# Patient Record
Sex: Female | Born: 1980
Health system: Southern US, Community
[De-identification: ages and names within clinical notes are randomized; demographics above are authoritative.]

## PROBLEM LIST (undated history)

## (undated) DIAGNOSIS — O24419 Gestational diabetes mellitus in pregnancy, unspecified control: Secondary | ICD-10-CM

## (undated) DIAGNOSIS — O139 Gestational [pregnancy-induced] hypertension without significant proteinuria, unspecified trimester: Secondary | ICD-10-CM

## (undated) DIAGNOSIS — Z87442 Personal history of urinary calculi: Secondary | ICD-10-CM

## (undated) DIAGNOSIS — N200 Calculus of kidney: Secondary | ICD-10-CM

## (undated) DIAGNOSIS — E039 Hypothyroidism, unspecified: Secondary | ICD-10-CM

---

## 2001-08-18 DIAGNOSIS — O24419 Gestational diabetes mellitus in pregnancy, unspecified control: Secondary | ICD-10-CM

## 2001-08-18 DIAGNOSIS — O139 Gestational [pregnancy-induced] hypertension without significant proteinuria, unspecified trimester: Secondary | ICD-10-CM

## 2001-08-18 HISTORY — DX: Gestational (pregnancy-induced) hypertension without significant proteinuria, unspecified trimester: O13.9

## 2001-08-18 HISTORY — DX: Gestational diabetes mellitus in pregnancy, unspecified control: O24.419

## 2003-01-19 ENCOUNTER — Other Ambulatory Visit: Admission: RE | Admit: 2003-01-19 | Discharge: 2003-01-19 | Payer: Self-pay | Admitting: Family Medicine

## 2008-03-02 ENCOUNTER — Other Ambulatory Visit: Admission: RE | Admit: 2008-03-02 | Discharge: 2008-03-02 | Payer: Self-pay | Admitting: Obstetrics and Gynecology

## 2009-04-17 ENCOUNTER — Other Ambulatory Visit: Admission: RE | Admit: 2009-04-17 | Discharge: 2009-04-17 | Payer: Self-pay | Admitting: Obstetrics and Gynecology

## 2010-05-29 ENCOUNTER — Other Ambulatory Visit: Admission: RE | Admit: 2010-05-29 | Discharge: 2010-05-29 | Payer: Self-pay | Admitting: Obstetrics and Gynecology

## 2011-12-19 LAB — OB RESULTS CONSOLE RPR: RPR: NONREACTIVE

## 2011-12-19 LAB — OB RESULTS CONSOLE GC/CHLAMYDIA
Chlamydia: NEGATIVE
Gonorrhea: NEGATIVE

## 2012-01-08 ENCOUNTER — Other Ambulatory Visit (HOSPITAL_COMMUNITY)
Admission: RE | Admit: 2012-01-08 | Discharge: 2012-01-08 | Disposition: A | Discharge status: Home / Self Care | Payer: PRIVATE HEALTH INSURANCE | Source: Ambulatory Visit | Attending: Obstetrics and Gynecology | Admitting: Obstetrics and Gynecology

## 2012-01-08 DIAGNOSIS — Z113 Encounter for screening for infections with a predominantly sexual mode of transmission: Secondary | ICD-10-CM | POA: Insufficient documentation

## 2012-01-08 DIAGNOSIS — Z01419 Encounter for gynecological examination (general) (routine) without abnormal findings: Secondary | ICD-10-CM | POA: Insufficient documentation

## 2012-06-24 LAB — OB RESULTS CONSOLE GBS: GBS: NEGATIVE

## 2012-07-14 ENCOUNTER — Other Ambulatory Visit: Payer: Self-pay | Admitting: Obstetrics and Gynecology

## 2012-07-18 ENCOUNTER — Inpatient Hospital Stay (HOSPITAL_COMMUNITY)
Admission: RE | Admit: 2012-07-18 | Discharge: 2012-07-23 | DRG: 766 | Disposition: A | Discharge status: Home / Self Care | Payer: PRIVATE HEALTH INSURANCE | Source: Ambulatory Visit | Attending: Obstetrics and Gynecology | Admitting: Obstetrics and Gynecology

## 2012-07-18 ENCOUNTER — Encounter (HOSPITAL_COMMUNITY): Payer: Self-pay

## 2012-07-18 ENCOUNTER — Other Ambulatory Visit: Payer: Self-pay | Admitting: Obstetrics and Gynecology

## 2012-07-18 VITALS — BP 123/80 | HR 71 | Temp 97.4°F | Resp 18 | Ht 63.0 in | Wt 182.0 lb

## 2012-07-18 DIAGNOSIS — O99814 Abnormal glucose complicating childbirth: Secondary | ICD-10-CM | POA: Diagnosis present

## 2012-07-18 DIAGNOSIS — Z98891 History of uterine scar from previous surgery: Secondary | ICD-10-CM

## 2012-07-18 DIAGNOSIS — O139 Gestational [pregnancy-induced] hypertension without significant proteinuria, unspecified trimester: Principal | ICD-10-CM | POA: Diagnosis present

## 2012-07-18 DIAGNOSIS — O324XX0 Maternal care for high head at term, not applicable or unspecified: Secondary | ICD-10-CM | POA: Diagnosis present

## 2012-07-18 HISTORY — DX: Gestational (pregnancy-induced) hypertension without significant proteinuria, unspecified trimester: O13.9

## 2012-07-18 HISTORY — DX: Gestational diabetes mellitus in pregnancy, unspecified control: O24.419

## 2012-07-18 LAB — CBC
MCHC: 34.9 g/dL (ref 30.0–36.0)
Platelets: 170 10*3/uL (ref 150–400)
RDW: 13.6 % (ref 11.5–15.5)

## 2012-07-18 MED ORDER — OXYTOCIN 40 UNITS IN LACTATED RINGERS INFUSION - SIMPLE MED: 1.0000 m[IU]/min | INTRAVENOUS | Status: DC

## 2012-07-18 MED ORDER — OXYTOCIN 40 UNITS IN LACTATED RINGERS INFUSION - SIMPLE MED: 62.5000 mL/h | INTRAVENOUS | Status: DC

## 2012-07-18 MED ORDER — ACETAMINOPHEN 325 MG PO TABS: 650.0000 mg | ORAL_TABLET | ORAL | Status: DC | PRN

## 2012-07-18 MED ORDER — CITRIC ACID-SODIUM CITRATE 334-500 MG/5ML PO SOLN
30.0000 mL | ORAL | Status: DC | PRN
  Administered 2012-07-20: 30 mL via ORAL
  Filled 2012-07-18: qty 15

## 2012-07-18 MED ORDER — LACTATED RINGERS IV SOLN
INTRAVENOUS | Status: DC
  Administered 2012-07-19: 22:00:00 via INTRAVENOUS
  Administered 2012-07-19: 125 mL/h via INTRAVENOUS
  Administered 2012-07-19: 20:00:00 via INTRAVENOUS

## 2012-07-18 MED ORDER — OXYCODONE-ACETAMINOPHEN 5-325 MG PO TABS: 1.0000 | ORAL_TABLET | ORAL | Status: DC | PRN

## 2012-07-18 MED ORDER — LIDOCAINE HCL (PF) 1 % IJ SOLN
30.0000 mL | INTRAMUSCULAR | Status: DC | PRN
  Filled 2012-07-18: qty 30

## 2012-07-18 MED ORDER — LACTATED RINGERS IV SOLN
500.0000 mL | INTRAVENOUS | Status: DC | PRN
  Administered 2012-07-19: 200 mL via INTRAVENOUS
  Administered 2012-07-20 (×2): 300 mL via INTRAVENOUS

## 2012-07-18 MED ORDER — IBUPROFEN 600 MG PO TABS: 600.0000 mg | ORAL_TABLET | Freq: Four times a day (QID) | ORAL | Status: DC | PRN

## 2012-07-18 MED ORDER — MISOPROSTOL 25 MCG QUARTER TABLET
25.0000 ug | ORAL_TABLET | ORAL | Status: DC | PRN
  Administered 2012-07-18 – 2012-07-19 (×3): 25 ug via VAGINAL
  Filled 2012-07-18 (×3): qty 0.25

## 2012-07-18 MED ORDER — BUTORPHANOL TARTRATE 1 MG/ML IJ SOLN: 1.0000 mg | INTRAMUSCULAR | Status: DC | PRN

## 2012-07-18 MED ORDER — OXYTOCIN BOLUS FROM INFUSION: 500.0000 mL | INTRAVENOUS | Status: DC

## 2012-07-18 MED ORDER — TERBUTALINE SULFATE 1 MG/ML IJ SOLN: 0.2500 mg | Freq: Once | INTRAMUSCULAR | Status: AC | PRN

## 2012-07-18 MED ORDER — ONDANSETRON HCL 4 MG/2ML IJ SOLN: 4.0000 mg | Freq: Four times a day (QID) | INTRAMUSCULAR | Status: DC | PRN

## 2012-07-19 ENCOUNTER — Encounter (HOSPITAL_COMMUNITY): Payer: Self-pay | Admitting: Anesthesiology

## 2012-07-19 LAB — CBC
HCT: 36.1 % (ref 36.0–46.0)
Hemoglobin: 12.4 g/dL (ref 12.0–15.0)
MCH: 30.4 pg (ref 26.0–34.0)
MCHC: 34.3 g/dL (ref 30.0–36.0)
MCHC: 34.4 g/dL (ref 30.0–36.0)
Platelets: 162 10*3/uL (ref 150–400)
RDW: 13.6 % (ref 11.5–15.5)
RDW: 13.6 % (ref 11.5–15.5)
WBC: 14 10*3/uL — ABNORMAL HIGH (ref 4.0–10.5)

## 2012-07-19 LAB — GLUCOSE, CAPILLARY
Glucose-Capillary: 112 mg/dL — ABNORMAL HIGH (ref 70–99)
Glucose-Capillary: 83 mg/dL (ref 70–99)

## 2012-07-19 LAB — COMPREHENSIVE METABOLIC PANEL
Albumin: 3 g/dL — ABNORMAL LOW (ref 3.5–5.2)
BUN: 7 mg/dL (ref 6–23)
Calcium: 9.3 mg/dL (ref 8.4–10.5)
GFR calc Af Amer: >90 mL/min (ref >90)
Glucose, Bld: 79 mg/dL (ref 70–99)
Potassium: 4.1 mEq/L (ref 3.5–5.1)
Total Protein: 6.7 g/dL (ref 6.0–8.3)

## 2012-07-19 LAB — RPR: RPR Ser Ql: NONREACTIVE

## 2012-07-19 LAB — LACTATE DEHYDROGENASE: LDH: 190 U/L (ref 94–250)

## 2012-07-19 LAB — URIC ACID: Uric Acid, Serum: 5.3 mg/dL (ref 2.4–7.0)

## 2012-07-19 MED ORDER — EPHEDRINE 5 MG/ML INJ: 10.0000 mg | INTRAVENOUS | Status: DC | PRN

## 2012-07-19 MED ORDER — LACTATED RINGERS IV SOLN
500.0000 mL | Freq: Once | INTRAVENOUS | Status: AC
  Administered 2012-07-19: 500 mL via INTRAVENOUS
  Administered 2012-07-20 (×2): via INTRAVENOUS

## 2012-07-19 MED ORDER — EPHEDRINE 5 MG/ML INJ
10.0000 mg | INTRAVENOUS | Status: DC | PRN
  Administered 2012-07-19: 10 mg via INTRAVENOUS
  Filled 2012-07-19: qty 4

## 2012-07-19 MED ORDER — OXYTOCIN 40 UNITS IN LACTATED RINGERS INFUSION - SIMPLE MED
1.0000 m[IU]/min | INTRAVENOUS | Status: DC
  Administered 2012-07-19: 2 m[IU]/min via INTRAVENOUS
  Filled 2012-07-19: qty 1000

## 2012-07-19 MED ORDER — LIDOCAINE HCL (PF) 1 % IJ SOLN
INTRAMUSCULAR | Status: DC | PRN
  Administered 2012-07-19 (×2): 8 mL

## 2012-07-19 MED ORDER — FENTANYL 2.5 MCG/ML BUPIVACAINE 1/10 % EPIDURAL INFUSION (WH - ANES)
14.0000 mL/h | INTRAMUSCULAR | Status: DC
  Administered 2012-07-20: 14 mL/h via EPIDURAL
  Filled 2012-07-19 (×2): qty 125

## 2012-07-19 MED ORDER — PHENYLEPHRINE 40 MCG/ML (10ML) SYRINGE FOR IV PUSH (FOR BLOOD PRESSURE SUPPORT): 80.0000 ug | PREFILLED_SYRINGE | INTRAVENOUS | Status: DC | PRN

## 2012-07-19 MED ORDER — DIPHENHYDRAMINE HCL 50 MG/ML IJ SOLN: 12.5000 mg | INTRAMUSCULAR | Status: DC | PRN

## 2012-07-19 MED ORDER — PHENYLEPHRINE 40 MCG/ML (10ML) SYRINGE FOR IV PUSH (FOR BLOOD PRESSURE SUPPORT)
80.0000 ug | PREFILLED_SYRINGE | INTRAVENOUS | Status: DC | PRN
  Filled 2012-07-19: qty 5

## 2012-07-19 MED ORDER — FENTANYL 2.5 MCG/ML BUPIVACAINE 1/10 % EPIDURAL INFUSION (WH - ANES)
INTRAMUSCULAR | Status: DC | PRN
  Administered 2012-07-19: 14 mL/h via EPIDURAL

## 2012-07-19 NOTE — Progress Notes (Signed)
[name] is a 31 y.o. G1P0000 at [redacted]w[redacted]d   Subjective: Pt becoming more uncomfortable. Considering epidural. Foley bulb came out ~ 2-3 hours after it was placed.  4-5 cm per RN.  Objective: BP 108/70  Pulse 115  Temp 97.7 F (36.5 C) (Oral)  Resp 18  Ht 5\' 3"  (1.6 m)  Wt 82.555 kg (182 lb)  BMI 32.24 kg/m2      FHT:  Reactive UC:   regular, every 3 minutes SVE:   Dilation: 5 Effacement (%): 70 Station: -2 Exam by:: Dr Levora Angel AROM-Thick meconium Labs: Lab Results  Component Value Date   WBC 15.1* 07/19/2012   HGB 12.4 07/19/2012   HCT 36.1 07/19/2012   MCV 88.5 07/19/2012   PLT 170 07/19/2012    Assessment / Plan: Induction of labor due to gestational hypertension and gestational diabetes,  progressing well on pitocin  Labor: Progressing normally Preeclampsia:  no signs or symptoms of toxicity Fetal Wellbeing:  Category I Pain Control:  Epidural I/D:  n/a Anticipated MOD:  NSVD  [name] 07/19/2012, 6:58 PM

## 2012-07-19 NOTE — Progress Notes (Signed)
Pt up in bathroom 1759 to now

## 2012-07-19 NOTE — H&P (Signed)
[name] is a 31 y.o. female G1 at 72 5/7 weeks presenting for induction of labor due to Gestational HTN and Gestational diabetes.  Pt had an occasional high normal BP in the second trimester.  In the later part of the third trimester, her BP was more consistantly high normal and then became mildly elevated.  Preeclampsia was ruled out with normal labs and 24 hour protein of 49.  Her last BP in office at 39 weeks was 146/96, repeat was 121/91.  Pt also was diagnosed with GDM at 28 weeks.  S/p diabetic teaching.  Glucose monitoring was very normal.  She was decreased to twice daily CBGs.  Ultrasound done ~ 1 week ago (11/20) revealed normal EFW, 7 lbs 7 ozs. Pt and husband were counseled on risk of prolonging pregnancy in the face of the above complications.  Counseled on risk of failed induction and fetal distress with induction and were agreeable to proceed.  Maternal Medical History:  Contractions: Frequency: irregular.    Fetal activity: Perceived fetal activity is normal.    Prenatal complications: Hypertension.   Prenatal Complications - Diabetes: gestational. Diabetes is managed by diet.      OB History    Grav Para Term Preterm Abortions TAB SAB Ect Mult Living   1 0 0 0 0 0 0 0 0 0      Past Medical History  Diagnosis Date  . Pregnancy induced hypertension   . Gestational diabetes    History reviewed. No pertinent past surgical history. Family History: family history is not on file. Social History:  reports that she has never smoked. She has never used smokeless tobacco. Her alcohol and drug histories not on file.   Prenatal Transfer Tool  Maternal Diabetes: Yes:  Diabetes Type:  Diet controlled Genetic Screening: Declined Maternal Ultrasounds/Referrals: Normal Fetal Ultrasounds or other Referrals:  None Maternal Substance Abuse:  No Significant Maternal Medications:  None Significant Maternal Lab Results:  Lab values include: Group B Strep negative Other  Comments:  None  Review of Systems  Eyes: Negative for blurred vision and double vision.  Gastrointestinal: Negative for abdominal pain.  Neurological: Negative for headaches.    Dilation: Fingertip Effacement (%): Thick Station: -3 Exam by:: Madilyn Fireman Blood pressure 127/88, pulse 89, temperature 97.7 F (36.5 C), temperature source Oral, resp. rate 18, height 5\' 3"  (1.6 m), weight 82.555 kg (182 lb). Maternal Exam:  Abdomen: Estimated fetal weight is 7.5-8 lbs.   Fetal presentation: vertex  Introitus: Normal vulva. Ferning test: not done.  Nitrazine test: not done.  Pelvis: adequate for delivery.   Cervix: Cervix evaluated by digital exam.     Fetal Exam Fetal Monitor Review: Variability: moderate (6-25 bpm).   Pattern: no decelerations.    Fetal State Assessment: Category I - tracings are normal.     Physical Exam  Constitutional: She is oriented to person, place, and time. She appears well-developed and well-nourished. No distress.  HENT:  Head: Normocephalic and atraumatic.  Eyes: EOM are normal.  Neck: Normal range of motion.  Cardiovascular: Normal rate and regular rhythm.   Respiratory: Effort normal and breath sounds normal. No respiratory distress. She has no wheezes. She has no rales.  GI: Soft. There is no tenderness.  Genitourinary: Uterus normal.  Musculoskeletal: Normal range of motion. She exhibits edema. She exhibits no tenderness.  Neurological: She is alert and oriented to person, place, and time.  Skin: Skin is warm and dry. She is not diaphoretic.  Psychiatric: She has a  normal mood and affect.    Prenatal labs: ABO, Rh: O/Positive/-- (05/03 0000) Antibody: Negative (05/03 0000) Rubella: Immune (05/03 0000) RPR: NON REACTIVE (12/01 2036)  HBsAg: Negative (05/03 0000)  HIV: Non-reactive (05/03 0000)  GBS:   Negative  CBC normal Hg, plts normal Preeclampsia labs not done.  Assessment/Plan: IUP at 39 5/7 weeks. IOL due to [facility] and  GDM. Cervix soft but not dilated.  Due to contractions, will proceed with Pitocin.  Unable to place a foley bulb.  Will attempt to place if still unfavorable at next check. Check preeclampsia labs. CBG q 4 hour.   Geryl Rankins 07/19/2012, 8:32 AM

## 2012-07-19 NOTE — Progress Notes (Signed)
[name] is a 31 y.o. G1P0000 at [redacted]w[redacted]d   Subjective: Pt comfortable s/p epidural.  Some increased pelvic pressure.  Cervix unchanged with last check at 9 pm per RN.  Objective: BP 121/69  Pulse 91  Temp 98.2 F (36.8 C) (Oral)  Resp 20  Ht 5\' 3"  (1.6 m)  Wt 82.555 kg (182 lb)  BMI 32.24 kg/m2  SpO2 100%      FHT:  Accels, early and variable decels..  Prolonged decel after IUPC placed and pt turned to left side.  Resolved with change of position to right side. UC:   regular, every 4-5 minutes SVE:   8/90/0, cervix very stretchy IUPC placed posteriorly, left without difficulty Labs: Lab Results  Component Value Date   WBC 14.0* 07/19/2012   HGB 12.3 07/19/2012   HCT 35.8* 07/19/2012   MCV 88.6 07/19/2012   PLT 162 07/19/2012    Assessment / Plan: Induction of labor due to gestational hypertension and gestational diabetes,  progressing well on pitocin  Labor: Progressing normally Preeclampsia:  no signs or symptoms of toxicity Fetal Wellbeing:  Category I Pain Control:  Epidural I/D:  n/a Anticipated MOD:  NSVD  Dreyah Montrose 07/19/2012, 10:56 PM

## 2012-07-19 NOTE — Anesthesia Preprocedure Evaluation (Signed)
Anesthesia Evaluation  Patient identified by MRN, date of birth, ID band Patient awake    Reviewed: Allergy & Precautions, H&P , NPO status , Patient's Chart, lab work & pertinent test results  Airway Mallampati: II TM Distance: >3 FB Neck ROM: full    Dental No notable dental hx.    Pulmonary neg pulmonary ROS,    Pulmonary exam normal       Cardiovascular     Neuro/Psych negative neurological ROS  negative psych ROS   GI/Hepatic negative GI ROS, Neg liver ROS,   Endo/Other  diabetes, Gestational  Renal/GU negative Renal ROS  negative genitourinary   Musculoskeletal negative musculoskeletal ROS (+)   Abdominal Normal abdominal exam  (+)   Peds negative pediatric ROS (+)  Hematology negative hematology ROS (+)   Anesthesia Other Findings   Reproductive/Obstetrics (+) Pregnancy                           Anesthesia Physical Anesthesia Plan  ASA: III  Anesthesia Plan: Epidural   Post-op Pain Management:    Induction:   Airway Management Planned:   Additional Equipment:   Intra-op Plan:   Post-operative Plan:   Informed Consent: I have reviewed the patients History and Physical, chart, labs and discussed the procedure including the risks, benefits and alternatives for the proposed anesthesia with the patient or authorized representative who has indicated his/her understanding and acceptance.     Plan Discussed with:   Anesthesia Plan Comments:         Anesthesia Quick Evaluation

## 2012-07-19 NOTE — Progress Notes (Signed)
Patient ID: [name], female   DOB: Sep 20, 1980, 31 y.o.   MRN: [id number]  Pt feeling  Contractions with Pitocin.  No headaches or visual changes.  Pt agreeable to Foley bulb placement after counseling on R/B/A.  BP 131/92 Gen: NAD Cvx 1/70/-3, less posterior EM:  Reassuring, Reactive.  Contractions q 2 min Pitocin 8 milliunits  Foley bulb placed with sterile technique, 60 cc.  No bleeding.  Pt tolerated well.  CBG 115, 117, 71  A/P:  IOL at 39 5/7 weeks Responding well to Pitocin.  S/p Foley bulb for mechanical dilation BPs normal.  Preeclampsia labs pending. CBGs normal. Continue current management.

## 2012-07-19 NOTE — Anesthesia Procedure Notes (Signed)
Epidural Patient location during procedure: OB Start time: 07/19/2012 7:38 PM End time: 07/19/2012 7:42 PM  Staffing Anesthesiologist: Sandrea Hughs Performed by: anesthesiologist   Preanesthetic Checklist Completed: patient identified, site marked, surgical consent, pre-op evaluation, timeout performed, IV checked, risks and benefits discussed and monitors and equipment checked  Epidural Patient position: sitting Prep: site prepped and draped and DuraPrep Patient monitoring: continuous pulse ox and blood pressure Approach: midline Injection technique: LOR air  Needle:  Needle type: Tuohy  Needle gauge: 17 G Needle length: 9 cm and 9 Needle insertion depth: 5 cm cm Catheter type: closed end flexible Catheter size: 19 Gauge Catheter at skin depth: 10 cm Test dose: negative and Other  Assessment Sensory level: T8 Events: blood not aspirated, injection not painful, no injection resistance, negative IV test and no paresthesia  Additional Notes Reason for block:procedure for pain

## 2012-07-20 ENCOUNTER — Encounter (HOSPITAL_COMMUNITY): Payer: Self-pay | Admitting: Anesthesiology

## 2012-07-20 ENCOUNTER — Inpatient Hospital Stay (HOSPITAL_COMMUNITY): Payer: PRIVATE HEALTH INSURANCE | Admitting: Anesthesiology

## 2012-07-20 ENCOUNTER — Encounter (HOSPITAL_COMMUNITY)
Admission: RE | Disposition: A | Discharge status: Home / Self Care | Payer: Self-pay | Source: Ambulatory Visit | Attending: Obstetrics and Gynecology

## 2012-07-20 ENCOUNTER — Encounter (HOSPITAL_COMMUNITY): Payer: Self-pay

## 2012-07-20 LAB — GLUCOSE, CAPILLARY: Glucose-Capillary: 80 mg/dL (ref 70–99)

## 2012-07-20 SURGERY — Surgical Case
Anesthesia: Epidural | Site: Abdomen | Wound class: Clean Contaminated

## 2012-07-20 MED ORDER — ONDANSETRON HCL 4 MG/2ML IJ SOLN: 4.0000 mg | Freq: Three times a day (TID) | INTRAMUSCULAR | Status: DC | PRN

## 2012-07-20 MED ORDER — DIPHENHYDRAMINE HCL 50 MG/ML IJ SOLN: 12.5000 mg | INTRAMUSCULAR | Status: DC | PRN

## 2012-07-20 MED ORDER — DIPHENHYDRAMINE HCL 25 MG PO CAPS: 25.0000 mg | ORAL_CAPSULE | Freq: Four times a day (QID) | ORAL | Status: DC | PRN

## 2012-07-20 MED ORDER — DIBUCAINE 1 % RE OINT: 1.0000 "application " | TOPICAL_OINTMENT | RECTAL | Status: DC | PRN

## 2012-07-20 MED ORDER — FERROUS SULFATE 325 (65 FE) MG PO TABS
325.0000 mg | ORAL_TABLET | Freq: Two times a day (BID) | ORAL | Status: DC
  Administered 2012-07-20 – 2012-07-23 (×6): 325 mg via ORAL
  Filled 2012-07-20 (×6): qty 1

## 2012-07-20 MED ORDER — CEFAZOLIN SODIUM-DEXTROSE 2-3 GM-% IV SOLR
INTRAVENOUS | Status: DC | PRN
  Administered 2012-07-20: 2 g via INTRAVENOUS

## 2012-07-20 MED ORDER — ONDANSETRON HCL 4 MG/2ML IJ SOLN
INTRAMUSCULAR | Status: AC
  Filled 2012-07-20: qty 2

## 2012-07-20 MED ORDER — WITCH HAZEL-GLYCERIN EX PADS: 1.0000 "application " | MEDICATED_PAD | CUTANEOUS | Status: DC | PRN

## 2012-07-20 MED ORDER — TETANUS-DIPHTH-ACELL PERTUSSIS 5-2.5-18.5 LF-MCG/0.5 IM SUSP: 0.5000 mL | Freq: Once | INTRAMUSCULAR | Status: DC

## 2012-07-20 MED ORDER — ZOLPIDEM TARTRATE 5 MG PO TABS: 5.0000 mg | ORAL_TABLET | Freq: Every evening | ORAL | Status: DC | PRN

## 2012-07-20 MED ORDER — DIPHENHYDRAMINE HCL 50 MG/ML IJ SOLN: 25.0000 mg | INTRAMUSCULAR | Status: DC | PRN

## 2012-07-20 MED ORDER — EPHEDRINE 5 MG/ML INJ
INTRAVENOUS | Status: AC
  Filled 2012-07-20: qty 10

## 2012-07-20 MED ORDER — METOCLOPRAMIDE HCL 5 MG/ML IJ SOLN: 10.0000 mg | Freq: Three times a day (TID) | INTRAMUSCULAR | Status: DC | PRN

## 2012-07-20 MED ORDER — MENTHOL 3 MG MT LOZG: 1.0000 | LOZENGE | OROMUCOSAL | Status: DC | PRN

## 2012-07-20 MED ORDER — SODIUM BICARBONATE 8.4 % IV SOLN
INTRAVENOUS | Status: DC | PRN
  Administered 2012-07-20 (×2): 5 mL via EPIDURAL

## 2012-07-20 MED ORDER — LACTATED RINGERS IV SOLN: INTRAVENOUS | Status: DC | PRN

## 2012-07-20 MED ORDER — LANOLIN HYDROUS EX OINT: 1.0000 "application " | TOPICAL_OINTMENT | CUTANEOUS | Status: DC | PRN

## 2012-07-20 MED ORDER — MISOPROSTOL 200 MCG PO TABS: 800.0000 ug | ORAL_TABLET | Freq: Once | ORAL | Status: AC | PRN

## 2012-07-20 MED ORDER — MORPHINE SULFATE (PF) 0.5 MG/ML IJ SOLN
INTRAMUSCULAR | Status: DC | PRN
  Administered 2012-07-20: 2000 ug via INTRAVENOUS
  Administered 2012-07-20: 3000 ug via EPIDURAL

## 2012-07-20 MED ORDER — LIDOCAINE HCL (CARDIAC) 20 MG/ML IV SOLN
INTRAVENOUS | Status: AC
  Filled 2012-07-20: qty 5

## 2012-07-20 MED ORDER — SODIUM CHLORIDE 0.9 % IJ SOLN: 3.0000 mL | INTRAMUSCULAR | Status: DC | PRN

## 2012-07-20 MED ORDER — HYDROMORPHONE HCL PF 1 MG/ML IJ SOLN: 0.2500 mg | INTRAMUSCULAR | Status: DC | PRN

## 2012-07-20 MED ORDER — FENTANYL CITRATE 0.05 MG/ML IJ SOLN
INTRAMUSCULAR | Status: AC
  Filled 2012-07-20: qty 2

## 2012-07-20 MED ORDER — KETOROLAC TROMETHAMINE 30 MG/ML IJ SOLN: 30.0000 mg | Freq: Four times a day (QID) | INTRAMUSCULAR | Status: AC | PRN

## 2012-07-20 MED ORDER — OXYTOCIN 40 UNITS IN LACTATED RINGERS INFUSION - SIMPLE MED: 62.5000 mL/h | INTRAVENOUS | Status: AC

## 2012-07-20 MED ORDER — SCOPOLAMINE 1 MG/3DAYS TD PT72
MEDICATED_PATCH | TRANSDERMAL | Status: AC
  Administered 2012-07-20: 1.5 mg via TRANSDERMAL
  Filled 2012-07-20: qty 1

## 2012-07-20 MED ORDER — FAMOTIDINE 20 MG PO TABS
20.0000 mg | ORAL_TABLET | Freq: Every day | ORAL | Status: DC
  Administered 2012-07-23: 20 mg via ORAL
  Filled 2012-07-20 (×2): qty 1

## 2012-07-20 MED ORDER — ONDANSETRON HCL 4 MG/2ML IJ SOLN: 4.0000 mg | INTRAMUSCULAR | Status: DC | PRN

## 2012-07-20 MED ORDER — KETOROLAC TROMETHAMINE 60 MG/2ML IM SOLN
60.0000 mg | Freq: Once | INTRAMUSCULAR | Status: AC | PRN
  Administered 2012-07-20: 60 mg via INTRAMUSCULAR

## 2012-07-20 MED ORDER — SIMETHICONE 80 MG PO CHEW
80.0000 mg | CHEWABLE_TABLET | Freq: Three times a day (TID) | ORAL | Status: DC
  Administered 2012-07-20 – 2012-07-23 (×11): 80 mg via ORAL

## 2012-07-20 MED ORDER — SENNOSIDES-DOCUSATE SODIUM 8.6-50 MG PO TABS
2.0000 | ORAL_TABLET | Freq: Every day | ORAL | Status: DC
  Administered 2012-07-20 – 2012-07-22 (×3): 2 via ORAL

## 2012-07-20 MED ORDER — MEPERIDINE HCL 25 MG/ML IJ SOLN
INTRAMUSCULAR | Status: DC | PRN
  Administered 2012-07-20 (×2): 12.5 mg via INTRAVENOUS

## 2012-07-20 MED ORDER — LACTATED RINGERS IV SOLN
INTRAVENOUS | Status: DC
  Administered 2012-07-20: 11:00:00 via INTRAVENOUS

## 2012-07-20 MED ORDER — NALBUPHINE HCL 10 MG/ML IJ SOLN
5.0000 mg | INTRAMUSCULAR | Status: DC | PRN
  Filled 2012-07-20: qty 1

## 2012-07-20 MED ORDER — KETOROLAC TROMETHAMINE 60 MG/2ML IM SOLN
INTRAMUSCULAR | Status: AC
  Administered 2012-07-20: 60 mg via INTRAMUSCULAR
  Filled 2012-07-20: qty 2

## 2012-07-20 MED ORDER — KETOROLAC TROMETHAMINE 30 MG/ML IJ SOLN: 15.0000 mg | Freq: Once | INTRAMUSCULAR | Status: DC | PRN

## 2012-07-20 MED ORDER — ONDANSETRON HCL 4 MG PO TABS: 4.0000 mg | ORAL_TABLET | ORAL | Status: DC | PRN

## 2012-07-20 MED ORDER — OXYTOCIN 10 UNIT/ML IJ SOLN
INTRAMUSCULAR | Status: AC
  Filled 2012-07-20: qty 4

## 2012-07-20 MED ORDER — SIMETHICONE 80 MG PO CHEW: 80.0000 mg | CHEWABLE_TABLET | ORAL | Status: DC | PRN

## 2012-07-20 MED ORDER — NALOXONE HCL 0.4 MG/ML IJ SOLN: 0.4000 mg | INTRAMUSCULAR | Status: DC | PRN

## 2012-07-20 MED ORDER — MEPERIDINE HCL 25 MG/ML IJ SOLN
INTRAMUSCULAR | Status: AC
  Filled 2012-07-20: qty 1

## 2012-07-20 MED ORDER — OXYTOCIN 40 UNITS IN LACTATED RINGERS INFUSION - SIMPLE MED: 1.0000 m[IU]/min | INTRAVENOUS | Status: DC

## 2012-07-20 MED ORDER — ONDANSETRON HCL 4 MG/2ML IJ SOLN: 4.0000 mg | Freq: Once | INTRAMUSCULAR | Status: DC | PRN

## 2012-07-20 MED ORDER — OXYTOCIN 10 UNIT/ML IJ SOLN
40.0000 [IU] | INTRAVENOUS | Status: DC | PRN
  Administered 2012-07-20: 40 [IU] via INTRAVENOUS

## 2012-07-20 MED ORDER — SODIUM BICARBONATE 8.4 % IV SOLN
INTRAVENOUS | Status: AC
  Filled 2012-07-20: qty 50

## 2012-07-20 MED ORDER — 0.9 % SODIUM CHLORIDE (POUR BTL) OPTIME
TOPICAL | Status: DC | PRN
  Administered 2012-07-20: 1000 mL

## 2012-07-20 MED ORDER — IBUPROFEN 600 MG PO TABS
600.0000 mg | ORAL_TABLET | Freq: Four times a day (QID) | ORAL | Status: DC
  Administered 2012-07-21 – 2012-07-23 (×10): 600 mg via ORAL
  Filled 2012-07-20 (×9): qty 1

## 2012-07-20 MED ORDER — CLINDAMYCIN PHOSPHATE 900 MG/50ML IV SOLN
900.0000 mg | Freq: Once | INTRAVENOUS | Status: AC
  Administered 2012-07-20: 900 mg via INTRAVENOUS
  Filled 2012-07-20: qty 50

## 2012-07-20 MED ORDER — OXYCODONE-ACETAMINOPHEN 5-325 MG PO TABS
1.0000 | ORAL_TABLET | ORAL | Status: DC | PRN
  Administered 2012-07-22: 1 via ORAL
  Filled 2012-07-20: qty 1

## 2012-07-20 MED ORDER — KETOROLAC TROMETHAMINE 30 MG/ML IJ SOLN
30.0000 mg | Freq: Four times a day (QID) | INTRAMUSCULAR | Status: AC | PRN
  Administered 2012-07-20: 30 mg via INTRAVENOUS
  Filled 2012-07-20: qty 1

## 2012-07-20 MED ORDER — PRENATAL MULTIVITAMIN CH
1.0000 | ORAL_TABLET | Freq: Every day | ORAL | Status: DC
  Administered 2012-07-21 – 2012-07-23 (×3): 1 via ORAL
  Filled 2012-07-20 (×3): qty 1

## 2012-07-20 MED ORDER — MORPHINE SULFATE 0.5 MG/ML IJ SOLN
INTRAMUSCULAR | Status: AC
  Filled 2012-07-20: qty 10

## 2012-07-20 MED ORDER — SCOPOLAMINE 1 MG/3DAYS TD PT72
1.0000 | MEDICATED_PATCH | Freq: Once | TRANSDERMAL | Status: AC
  Administered 2012-07-20: 1.5 mg via TRANSDERMAL

## 2012-07-20 MED ORDER — MEPERIDINE HCL 25 MG/ML IJ SOLN: 6.2500 mg | INTRAMUSCULAR | Status: DC | PRN

## 2012-07-20 MED ORDER — ONDANSETRON HCL 4 MG/2ML IJ SOLN
INTRAMUSCULAR | Status: DC | PRN
  Administered 2012-07-20: 4 mg via INTRAVENOUS

## 2012-07-20 MED ORDER — FENTANYL CITRATE 0.05 MG/ML IJ SOLN
INTRAMUSCULAR | Status: DC | PRN
  Administered 2012-07-20 (×2): 50 ug via INTRAVENOUS

## 2012-07-20 MED ORDER — DIPHENHYDRAMINE HCL 25 MG PO CAPS
25.0000 mg | ORAL_CAPSULE | ORAL | Status: DC | PRN
  Filled 2012-07-20: qty 1

## 2012-07-20 MED ORDER — PHENYLEPHRINE 40 MCG/ML (10ML) SYRINGE FOR IV PUSH (FOR BLOOD PRESSURE SUPPORT)
PREFILLED_SYRINGE | INTRAVENOUS | Status: AC
  Filled 2012-07-20: qty 5

## 2012-07-20 MED ORDER — NALOXONE HCL 1 MG/ML IJ SOLN: 1.0000 ug/kg/h | INTRAVENOUS | Status: DC | PRN

## 2012-07-20 SURGICAL SUPPLY — 44 items
BARRIER ADHS 3X4 INTERCEED (GAUZE/BANDAGES/DRESSINGS) ×2 IMPLANT
BENZOIN TINCTURE PRP APPL 2/3 (GAUZE/BANDAGES/DRESSINGS) ×2 IMPLANT
CLOTH BEACON ORANGE TIMEOUT ST (SAFETY) ×2 IMPLANT
DERMABOND ADVANCED (GAUZE/BANDAGES/DRESSINGS)
DERMABOND ADVANCED .7 DNX12 (GAUZE/BANDAGES/DRESSINGS) IMPLANT
DRESSING TELFA 8X3 (GAUZE/BANDAGES/DRESSINGS) ×2 IMPLANT
DRSG COVADERM 4X10 (GAUZE/BANDAGES/DRESSINGS) ×2 IMPLANT
DURAPREP 26ML APPLICATOR (WOUND CARE) ×2 IMPLANT
ELECT REM PT RETURN 9FT ADLT (ELECTROSURGICAL) ×2
ELECTRODE REM PT RTRN 9FT ADLT (ELECTROSURGICAL) ×1 IMPLANT
EXTRACTOR VACUUM BELL STYLE (SUCTIONS) ×2 IMPLANT
GAUZE SPONGE 4X4 12PLY STRL LF (GAUZE/BANDAGES/DRESSINGS) ×4 IMPLANT
GLOVE BIO SURGEON STRL SZ7 (GLOVE) ×2 IMPLANT
GLOVE BIOGEL PI IND STRL 6.5 (GLOVE) ×2 IMPLANT
GLOVE BIOGEL PI IND STRL 7.0 (GLOVE) ×2 IMPLANT
GLOVE BIOGEL PI INDICATOR 6.5 (GLOVE) ×2
GLOVE BIOGEL PI INDICATOR 7.0 (GLOVE) ×2
GLOVE ECLIPSE 6.0 STRL STRAW (GLOVE) ×4 IMPLANT
GOWN PREVENTION PLUS LG XLONG (DISPOSABLE) ×4 IMPLANT
GOWN PREVENTION PLUS XLARGE (GOWN DISPOSABLE) IMPLANT
KIT ABG SYR 3ML LUER SLIP (SYRINGE) IMPLANT
NEEDLE HYPO 25X5/8 SAFETYGLIDE (NEEDLE) IMPLANT
NS IRRIG 1000ML POUR BTL (IV SOLUTION) ×2 IMPLANT
PACK C SECTION WH (CUSTOM PROCEDURE TRAY) ×2 IMPLANT
PAD ABD 7.5X8 STRL (GAUZE/BANDAGES/DRESSINGS) ×2 IMPLANT
PAD OB MATERNITY 4.3X12.25 (PERSONAL CARE ITEMS) ×2 IMPLANT
RTRCTR C-SECT PINK 25CM LRG (MISCELLANEOUS) ×2 IMPLANT
SLEEVE SCD COMPRESS KNEE MED (MISCELLANEOUS) ×2 IMPLANT
SPONGE GAUZE 4X4 12PLY (GAUZE/BANDAGES/DRESSINGS) ×2 IMPLANT
STRIP CLOSURE SKIN 1/2X4 (GAUZE/BANDAGES/DRESSINGS) ×2 IMPLANT
SUT CHROMIC 0 CTX 36 (SUTURE) ×6 IMPLANT
SUT MNCRL AB 4-0 PS2 18 (SUTURE) ×2 IMPLANT
SUT PLAIN 2 0 (SUTURE)
SUT PLAIN 2 0 XLH (SUTURE) ×2 IMPLANT
SUT PLAIN ABS 2-0 54XMFL TIE (SUTURE) IMPLANT
SUT VIC AB 0 CT1 27 (SUTURE) ×1
SUT VIC AB 0 CT1 27XBRD ANBCTR (SUTURE) ×1 IMPLANT
SUT VIC AB 2-0 CT1 27 (SUTURE) ×1
SUT VIC AB 2-0 CT1 TAPERPNT 27 (SUTURE) ×1 IMPLANT
SUT VIC AB 4-0 KS 27 (SUTURE) IMPLANT
TAPE CLOTH SURG 4X10 WHT LF (GAUZE/BANDAGES/DRESSINGS) ×2 IMPLANT
TOWEL OR 17X24 6PK STRL BLUE (TOWEL DISPOSABLE) ×4 IMPLANT
TRAY FOLEY CATH 14FR (SET/KITS/TRAYS/PACK) IMPLANT
WATER STERILE IRR 1000ML POUR (IV SOLUTION) IMPLANT

## 2012-07-20 NOTE — Anesthesia Postprocedure Evaluation (Deleted)
Anesthesia Post Note  Patient: [name]  Procedure(s) Performed: Procedure(s) (LRB): CESAREAN SECTION (N/A)  Anesthesia type: epidural  Patient location: PACU  Post pain: Pain level controlled  Post assessment: Post-op Vital signs reviewed  Last Vitals:  Filed Vitals:   07/20/12 0605  BP: 140/77  Pulse: 81  Temp:   Resp:     Post vital signs: Reviewed  Level of consciousness: awake  Complications: No apparent anesthesia complications

## 2012-07-20 NOTE — Progress Notes (Signed)
Patient ID: [name], female   DOB: September 23, 1980, 31 y.o.   MRN: [id number]  In to assess pt.   Pt comfortable but has hot spot on right lower abdomen.   121/77 Gen:  NAD EM:  Good variability, variable decels with a late component in left lateral decubitus.  Resolved repositioning. Cervix:  Anterior lip reducible, +1 station. Trial of pushing with prolonged fetal decels.  Maternal effort with pushing was minimally to moderately effective. Stopped pushing to allow baby to labor down to +2 station and resuscitation with IVF bolus. Monitor fetal heart rate closely.  If non-reassuring despite efforts proceed with c-section. Discussed with pt and husband.

## 2012-07-20 NOTE — Op Note (Signed)
[name], [name]            ACCOUNT NO.:  [id number]  MEDICAL RECORD NO.:  [id number]  LOCATION:  [facility]                          FACILITY:  [facility]  PHYSICIAN:  Pieter Partridge, MD   DATE OF BIRTH:  10/31/1980  DATE OF PROCEDURE:  07/20/2012 DATE OF DISCHARGE:                              OPERATIVE REPORT   PREOPERATIVE DIAGNOSES:  Pregnancy at 39-6/7th weeks, failure to descend, gestational hypertension, gestational diabetes.  POSTOPERATIVE DIAGNOSES:  Pregnancy at 39-6/7th weeks, failure to descend, gestational hypertension, gestational diabetes.  SURGEON:  Pieter Partridge, MD  ASSISTANT:  Technician.  ANESTHESIA:  Epidural.  IV FLUIDS:  In 1000, urine 250 out, blood tinged to clear.  ESTIMATED BLOOD LOSS:  700.  DRAINS:  Foley catheter.  No local use.  SPECIMENS:  Placenta to pathology.  DISPOSITION:  To PACU hemodynamically stable.  No complications.  FINDINGS:  Vertex female, suspect occiput posterior position.  Apgars 9 and 9.  Velamentous insertion of umbilical cord noted.  Terminal meconium also.  Normal uterus, tubes.  INDICATION FOR PROCEDURE:  [name] is a 31 year old gravida 1 at 86 and 6/7th weeks who was admitted for induction of labor due to gestational hypertension and gestational diabetes.  She underwent cervical ripening of approximately 3 doses.  She then had Pitocin induction with Foley bulb and progressed to 9 cm and probably became complete after a few hours.  She pushed for approximately 2 hours with no descent and caput was increasing.  Due to protracted labor course previously and increasing caput, the patient was counseled on primary low transverse cesarean section.  PROCEDURE IN DETAIL:  The patient was taken to the operating room and placed in the dorsal supine position.  Epidural anesthesia was optimized.  She was then prepped and draped in normal sterile fashion. Time-out was taken prior to draping.  After adequate anesthesia  confirmed, a Pfannenstiel skin incision was made 2 cm above the symphysis pubis after the abdomen was marked. Incision was made with a scalpel and carried down to the underlying layer of fascia with a Bovie.  The subcutaneous base was cauterized as needed with Bovie.  Fascia was incised and the incision extended laterally.  The rectus muscles were then dissected off the muscles and entered the fascia sharply with Kocher clamps.  Same was done on the lower leaf.  The muscles were cut at the midline to open and reveal the peritoneum.  Peritoneum was identified and entered sharply with the Metzenbaum scissors.  The peritoneum was stretched and cut with Metzenbaum scissors.  Adequate hemostasis was confirmed.  The Alexis retractor was then placed.  The serosa was then tented up with Russians and entered sharply with the Metzenbaum scissors and a bladder flap was developed.  A transverse incision on the lower uterine segment was made with a scalpel.  Meconium noted and the amnion was incised.  Incision was extended laterally with the bandage scissors.  The hands were noted at the incision.  The occiput was delivered easily through the incision.  Just by palpation, I felt that the baby was OP.  The baby was not wedged into the pelvis, and again was easily delivered through the incision.  Adequate fundal pressure was applied.  However, the occiput was not delivered.  Vacuum was applied to the occiput atraumatically and the baby was then delivered.  No nuchal cord noted.  Nose and mouth were suctioned. Shoulders delivered easily.  The baby continued to be suctioned.  Cord clamped x2 and cut.  Baby placed at the bedside to the waiting NICU team.  Cord blood obtained from the placenta.  The placenta was removed manually and velamentous insertion was noted.  The uterus was then cleared of all clots and debris x2 with a moist laparotomy sponge. Fundal massage was performed to firm the uterus.   Hysterotomy incision was reapproximated with 0 chromic in a running locked fashion, a second layer of the same suture was used for imbrication and adequate hemostasis was noted.  The bladder was observed and was well out of the way of the incision and was not harmed in any way.  Copious irrigation of the gutters was performed on the right side.  The small intestines came down to the incision that had been packed away with a portion of a laparotomy sponge and then that lap was removed prior to irrigation.  There were no fascial layers that were bleeding. The fascia was reapproximated with 0 Vicryl in a continuous running fashion.  The subcu layer was reapproximated with 2-0 plain gut and the skin was reapproximated with 4-0 Monocryl.  Steri-Strips and a pressure dressing were applied.  The patient tolerated the procedure well.  All instrument, sponge, and needle counts were correct x3.  She had Ancef 2 g IV prior to the procedure.  She would get clindamycin in recovery.  Baby did well.  All sponge, instrument, and needle counts were correct x3.  The patient taken to recovery room in stable condition.     Pieter Partridge, MD     EBV/MEDQ  D:  07/20/2012  T:  07/20/2012  Job:  [id number]

## 2012-07-20 NOTE — Anesthesia Postprocedure Evaluation (Signed)
Anesthesia Post Note  Patient: [name]  Procedure(s) Performed: Procedure(s) (LRB): CESAREAN SECTION (N/A)  Anesthesia type: Epidural  Patient location: PACU  Post pain: Pain level controlled  Post assessment: Post-op Vital signs reviewed  Last Vitals:  Filed Vitals:   07/20/12 0830  BP: 118/66  Pulse: 95  Temp:   Resp: 22    Post vital signs: stable  Level of consciousness: awake  Complications: No apparent anesthesia complications

## 2012-07-20 NOTE — OR Nursing (Signed)
1610 Interceed 3" X 4" used intra-operatively by Dr. Dion Body.

## 2012-07-20 NOTE — Anesthesia Postprocedure Evaluation (Signed)
  Anesthesia Post-op Note  Patient: [name]  Procedure(s) Performed: Procedure(s) (LRB) with comments: CESAREAN SECTION (N/A) - Primary Cesarean Section Delivery Boy @  [phone], Apgars 9/9  Patient Location: Mother/Baby  Anesthesia Type:Epidural  Level of Consciousness: awake, alert  and oriented  Airway and Oxygen Therapy: Patient Spontanous Breathing  Post-op Pain: mild  Post-op Assessment: Patient's Cardiovascular Status Stable, Respiratory Function Stable, No headache, No backache, No residual numbness and No residual motor weakness  Post-op Vital Signs: stable  Complications: No apparent anesthesia complications

## 2012-07-20 NOTE — Transfer of Care (Signed)
Immediate Anesthesia Transfer of Care Note  Patient: [name]  Procedure(s) Performed: Procedure(s) (LRB) with comments: CESAREAN SECTION (N/A) - Primary Cesarean Section Delivery Boy @  [phone], Apgars 9/9  Patient Location: PACU  Anesthesia Type:Epidural  Level of Consciousness: awake, alert , oriented and patient cooperative  Airway & Oxygen Therapy: Patient Spontanous Breathing  Post-op Assessment: Report given to PACU RN and Post -op Vital signs reviewed and stable  Post vital signs: Reviewed and stable  Complications: No apparent anesthesia complications

## 2012-07-20 NOTE — Brief Op Note (Signed)
07/18/2012 - 07/20/2012  7:49 AM  PATIENT:  [name]  31 y.o. female  PRE-OPERATIVE DIAGNOSIS:  Failure to Descend  POST-OPERATIVE DIAGNOSIS:  Failure to Descend  PROCEDURE:  Procedure(s) (LRB) with comments: CESAREAN SECTION (N/A) - Primary Cesarean Section Delivery Boy @  [phone], Apgars 9/9  SURGEON:  Surgeon(s) and Role:    * Geryl Rankins, MD - Primary  PHYSICIAN ASSISTANT: None  ASSISTANTS: Technician   ANESTHESIA:   epidural  EBL:  Total I/O In: 1000 [I.V.:1000] Out: 950 [Urine:250; Blood:700]  BLOOD ADMINISTERED:none  DRAINS: Urinary Catheter (Foley)   LOCAL MEDICATIONS USED:  NONE  SPECIMEN:  Source of Specimen:  Placenta  DISPOSITION OF SPECIMEN:  PATHOLOGY  COUNTS:  YES  TOURNIQUET:  * No tourniquets in log *  DICTATION: .Other Dictation: Dictation Number [id number]  PLAN OF CARE: Transfer to postpartum  PATIENT DISPOSITION:  PACU - hemodynamically stable.   Delay start of Pharmacological VTE agent (>24hrs) due to surgical blood loss or risk of bleeding: not applicable

## 2012-07-20 NOTE — Progress Notes (Signed)
Patient ID: [name], female   DOB: April 30, 1981, 31 y.o.   MRN: [id number]  Pt has been pushing for 1 hour 30 minutes after our initial 30 minute set. EM:  Good variability and accels Still at +1 more caput.   Arrest of descent.   Recommend primary LTCS.  Counseled on R/B/A.  All questions answered.  Consent obtained. Also consented on circumcision.

## 2012-07-20 NOTE — Progress Notes (Signed)
Patient ID: [name], female   DOB: 03-Aug-1981, 30 y.o.   MRN: [id number]  In to assess pt.  Pt, husband and baby sleeping soundly.  Pt not disturbed. VSS. UOP reassuring. Continue routine post op care.

## 2012-07-20 NOTE — Addendum Note (Signed)
Addendum  created 07/20/12 1409 by Earmon Phoenix, CRNA   Modules edited:Notes Section

## 2012-07-21 ENCOUNTER — Encounter (HOSPITAL_COMMUNITY): Payer: Self-pay | Admitting: Obstetrics and Gynecology

## 2012-07-21 ENCOUNTER — Inpatient Hospital Stay (HOSPITAL_COMMUNITY): Admission: AD | Admit: 2012-07-21 | Payer: Self-pay | Source: Ambulatory Visit | Admitting: Obstetrics and Gynecology

## 2012-07-21 LAB — CBC
Hemoglobin: 9 g/dL — ABNORMAL LOW (ref 12.0–15.0)
MCH: 29.8 pg (ref 26.0–34.0)
MCHC: 33.5 g/dL (ref 30.0–36.0)
RDW: 14 % (ref 11.5–15.5)

## 2012-07-21 NOTE — Progress Notes (Signed)
Subjective: Postpartum Day 1: Cesarean Delivery Patient reports she is doing well.  Lightheaded this am but that has resolved.  Pain controlled.  Minimal bleeding.  +Breast feeding.  Objective: Vital signs in last 24 hours: Temp:  [97.1 F (36.2 C)-98.4 F (36.9 C)] 97.8 F (36.6 C) (12/04 1355) Pulse Rate:  [73-92] 92  (12/04 1355) Resp:  [16-18] 18  (12/04 1355) BP: (91-99)/(52-62) 93/56 mmHg (12/04 1355) SpO2:  [97 %-98 %] 97 % (12/04 0715)  Physical Exam:  General: alert, cooperative and no distress Lochia: Not assessed at perineum Uterine Fundus: firm Incision: healing well, no significant drainage DVT Evaluation: No evidence of DVT seen on physical exam. Calf/Ankle edema is present.   Basename 07/21/12 0535 07/19/12 1850  HGB 9.0* 12.3  HCT 26.9* 35.8*    Assessment/Plan: Status post Cesarean section. Doing well postoperatively.  Continue current care. Encouraged ambulation.  [name] 07/21/2012, 6:18 PM

## 2012-07-22 NOTE — Progress Notes (Signed)
Subjective: Postpartum Day 2: Cesarean Delivery Patient reports incisional pain and tolerating PO.  Pain controlled.  +Breast feeding.  Objective: Vital signs in last 24 hours: Temp:  [97.4 F (36.3 C)-97.8 F (36.6 C)] 97.6 F (36.4 C) (12/05 0530) Pulse Rate:  [79-92] 79  (12/05 0530) Resp:  [18] 18  (12/05 0530) BP: (93-111)/(56-86) 111/86 mmHg (12/05 0530)  Physical Exam:  General: alert, cooperative and no distress Lochia: appropriate Uterine Fundus: firm Incision: healing well DVT Evaluation: No evidence of DVT seen on physical exam. Edema present.   Basename 07/21/12 0535 07/19/12 1850  HGB 9.0* 12.3  HCT 26.9* 35.8*    Assessment/Plan: Status post Cesarean section. Doing well postoperatively.  Continue current care. Discharge home tomorrow.  Geryl Rankins 07/22/2012, 12:13 PM

## 2012-07-22 NOTE — Progress Notes (Signed)
UR chart review completed.  

## 2012-07-23 LAB — CBC
MCV: 90.7 fL (ref 78.0–100.0)
Platelets: 130 10*3/uL — ABNORMAL LOW (ref 150–400)
RDW: 13.9 % (ref 11.5–15.5)
WBC: 12.3 10*3/uL — ABNORMAL HIGH (ref 4.0–10.5)

## 2012-07-23 MED ORDER — FERROUS SULFATE 325 (65 FE) MG PO TABS: 325.0000 mg | ORAL_TABLET | Freq: Every day | ORAL | Status: DC

## 2012-07-23 MED ORDER — OXYCODONE-ACETAMINOPHEN 5-325 MG PO TABS: 1.0000 | ORAL_TABLET | Freq: Four times a day (QID) | ORAL | Status: DC | PRN

## 2012-07-23 MED ORDER — IBUPROFEN 600 MG PO TABS: 600.0000 mg | ORAL_TABLET | Freq: Four times a day (QID) | ORAL | Status: DC | PRN

## 2012-07-23 NOTE — Discharge Summary (Signed)
Obstetric Discharge Summary Reason for Admission: induction of labor Prenatal Procedures: none Intrapartum Procedures: cesarean: low cervical, transverse Postpartum Procedures: none Complications-Operative and Postpartum: gestational hypertension  Hemoglobin  Date Value Range Status  07/23/2012 8.5* 12.0 - 15.0 g/dL Final     HCT  Date Value Range Status  07/23/2012 25.4* 36.0 - 46.0 % Final    Physical Exam:  General: alert and cooperative Lochia: appropriate Uterine Fundus: firm Incision: healing well DVT Evaluation: No evidence of DVT seen on physical exam.  Discharge Diagnoses: Term Pregnancy-delivered  Discharge Information: Date: 07/23/2012 Activity: pelvic rest Diet: routine Medications: PNV, Ibuprofen, Iron and Percocet Condition: stable Instructions: refer to practice specific booklet Discharge to: home Follow-up Information    Follow up with Geryl Rankins, MD. Schedule an appointment as soon as possible for a visit in 2 weeks. (Post op/BP check)    Contact information:   [address], [address] [place] [address] [phone]          Newborn Data: Live born female  Birth Weight: 8 lb 0.4 oz (3640 g) APGAR: 9, 9  Home with mother.  Desjuan Stearns J. 07/23/2012, 8:10 AM

## 2012-07-23 NOTE — Progress Notes (Signed)
Post Partum Day 3 s/p cesarean section  Subjective: no complaints, up ad lib, voiding and tolerating PO  Objective: Blood pressure 123/80, pulse 71, temperature 97.4 F (36.3 C), temperature source Oral, resp. rate 18, height 5\' 3"  (1.6 m), weight 82.555 kg (182 lb), SpO2 97.00%, unknown if currently breastfeeding.  Physical Exam:  General: alert and cooperative Lochia: appropriate Uterine Fundus: firm Incision: healing well DVT Evaluation: No evidence of DVT seen on physical exam.   Basename 07/23/12 0535 07/21/12 0535  HGB 8.5* 9.0*  HCT 25.4* 26.9*    Assessment/Plan: Discharge home and Breastfeeding   LOS: 5 days   Khristin Keleher J. 07/23/2012, 8:07 AM

## 2013-12-04 ENCOUNTER — Telehealth: Payer: Self-pay | Admitting: Obstetrics and Gynecology

## 2013-12-04 ENCOUNTER — Encounter (HOSPITAL_COMMUNITY): Payer: 59

## 2013-12-04 ENCOUNTER — Inpatient Hospital Stay (HOSPITAL_COMMUNITY): Payer: 59

## 2013-12-04 ENCOUNTER — Encounter (HOSPITAL_COMMUNITY)
Admission: AD | Disposition: A | Discharge status: Home / Self Care | Payer: Self-pay | Source: Ambulatory Visit | Attending: Obstetrics and Gynecology

## 2013-12-04 ENCOUNTER — Observation Stay (HOSPITAL_COMMUNITY)
Admission: AD | Admit: 2013-12-04 | Discharge: 2013-12-05 | Disposition: A | Discharge status: Home / Self Care | Payer: 59 | Source: Ambulatory Visit | Attending: Obstetrics and Gynecology | Admitting: Obstetrics and Gynecology

## 2013-12-04 ENCOUNTER — Encounter (HOSPITAL_COMMUNITY): Payer: Self-pay | Admitting: *Deleted

## 2013-12-04 DIAGNOSIS — I959 Hypotension, unspecified: Secondary | ICD-10-CM

## 2013-12-04 DIAGNOSIS — R55 Syncope and collapse: Secondary | ICD-10-CM

## 2013-12-04 DIAGNOSIS — O039 Complete or unspecified spontaneous abortion without complication: Secondary | ICD-10-CM | POA: Diagnosis present

## 2013-12-04 DIAGNOSIS — O031 Delayed or excessive hemorrhage following incomplete spontaneous abortion: Principal | ICD-10-CM

## 2013-12-04 HISTORY — PX: DILATION AND EVACUATION: SHX1459

## 2013-12-04 LAB — CBC WITH DIFFERENTIAL/PLATELET
Basophils Absolute: 0 10*3/uL (ref 0.0–0.1)
Basophils Relative: 0 % (ref 0–1)
Eosinophils Absolute: 0 10*3/uL (ref 0.0–0.7)
Eosinophils Relative: 0 % (ref 0–5)
HCT: 26.6 % — ABNORMAL LOW (ref 36.0–46.0)
HEMOGLOBIN: 9.3 g/dL — AB (ref 12.0–15.0)
LYMPHS ABS: 1.1 10*3/uL (ref 0.7–4.0)
Lymphocytes Relative: 8 % — ABNORMAL LOW (ref 12–46)
MCH: 29.3 pg (ref 26.0–34.0)
MCHC: 35 g/dL (ref 30.0–36.0)
MCV: 83.9 fL (ref 78.0–100.0)
MONOS PCT: 4 % (ref 3–12)
Monocytes Absolute: 0.6 10*3/uL (ref 0.1–1.0)
NEUTROS PCT: 88 % — AB (ref 43–77)
Neutro Abs: 12.9 10*3/uL — ABNORMAL HIGH (ref 1.7–7.7)
Platelets: 173 10*3/uL (ref 150–400)
RBC: 3.17 MIL/uL — AB (ref 3.87–5.11)
RDW: 12.6 % (ref 11.5–15.5)
WBC: 14.7 10*3/uL — ABNORMAL HIGH (ref 4.0–10.5)

## 2013-12-04 LAB — CBC
HEMATOCRIT: 30.8 % — AB (ref 36.0–46.0)
HEMATOCRIT: 20.1 % — AB (ref 36.0–46.0)
Hemoglobin: 10.9 g/dL — ABNORMAL LOW (ref 12.0–15.0)
Hemoglobin: 7.1 g/dL — ABNORMAL LOW (ref 12.0–15.0)
MCH: 29.9 pg (ref 26.0–34.0)
MCH: 29.8 pg (ref 26.0–34.0)
MCHC: 35.4 g/dL (ref 30.0–36.0)
MCHC: 35.3 g/dL (ref 30.0–36.0)
MCV: 84.6 fL (ref 78.0–100.0)
MCV: 84.5 fL (ref 78.0–100.0)
PLATELETS: 164 10*3/uL (ref 150–400)
Platelets: 143 10*3/uL — ABNORMAL LOW (ref 150–400)
RBC: 3.64 MIL/uL — AB (ref 3.87–5.11)
RBC: 2.38 MIL/uL — ABNORMAL LOW (ref 3.87–5.11)
RDW: 12.7 % (ref 11.5–15.5)
RDW: 12.8 % (ref 11.5–15.5)
WBC: 14.6 10*3/uL — AB (ref 4.0–10.5)
WBC: 12.7 10*3/uL — ABNORMAL HIGH (ref 4.0–10.5)

## 2013-12-04 LAB — HEMOGLOBIN: Hemoglobin: 9.6 g/dL — ABNORMAL LOW (ref 12.0–15.0)

## 2013-12-04 LAB — PREPARE RBC (CROSSMATCH)

## 2013-12-04 SURGERY — DILATION AND EVACUATION, UTERUS
Anesthesia: Monitor Anesthesia Care | Site: Vagina

## 2013-12-04 MED ORDER — PROPOFOL 10 MG/ML IV EMUL
INTRAVENOUS | Status: AC
  Filled 2013-12-04: qty 20

## 2013-12-04 MED ORDER — CHLOROPROCAINE HCL 1 % IJ SOLN
INTRAMUSCULAR | Status: DC | PRN
  Administered 2013-12-04: 20 mL

## 2013-12-04 MED ORDER — CHLOROPROCAINE HCL 1 % IJ SOLN
INTRAMUSCULAR | Status: AC
  Filled 2013-12-04: qty 30

## 2013-12-04 MED ORDER — ONDANSETRON HCL 4 MG/2ML IJ SOLN: 4.0000 mg | Freq: Four times a day (QID) | INTRAMUSCULAR | Status: DC | PRN

## 2013-12-04 MED ORDER — ONDANSETRON HCL 4 MG/2ML IJ SOLN
INTRAMUSCULAR | Status: DC | PRN
  Administered 2013-12-04: 4 mg via INTRAVENOUS

## 2013-12-04 MED ORDER — EPHEDRINE 5 MG/ML INJ
INTRAVENOUS | Status: AC
  Filled 2013-12-04: qty 10

## 2013-12-04 MED ORDER — LIDOCAINE HCL (CARDIAC) 20 MG/ML IV SOLN
INTRAVENOUS | Status: AC
  Filled 2013-12-04: qty 5

## 2013-12-04 MED ORDER — KETOROLAC TROMETHAMINE 30 MG/ML IJ SOLN
INTRAMUSCULAR | Status: DC | PRN
  Administered 2013-12-04: 30 mg via INTRAVENOUS

## 2013-12-04 MED ORDER — FENTANYL CITRATE 0.05 MG/ML IJ SOLN
INTRAMUSCULAR | Status: DC | PRN
  Administered 2013-12-04: 100 ug via INTRAVENOUS

## 2013-12-04 MED ORDER — KETOROLAC TROMETHAMINE 30 MG/ML IJ SOLN: 15.0000 mg | Freq: Once | INTRAMUSCULAR | Status: DC | PRN

## 2013-12-04 MED ORDER — MENTHOL 3 MG MT LOZG: 1.0000 | LOZENGE | OROMUCOSAL | Status: DC | PRN

## 2013-12-04 MED ORDER — LACTATED RINGERS IV SOLN
INTRAVENOUS | Status: DC
  Administered 2013-12-04 (×3): via INTRAVENOUS

## 2013-12-04 MED ORDER — IBUPROFEN 600 MG PO TABS: 600.0000 mg | ORAL_TABLET | Freq: Four times a day (QID) | ORAL | Status: DC | PRN

## 2013-12-04 MED ORDER — MIDAZOLAM HCL 2 MG/2ML IJ SOLN
INTRAMUSCULAR | Status: DC | PRN
  Administered 2013-12-04: 2 mg via INTRAVENOUS

## 2013-12-04 MED ORDER — MIDAZOLAM HCL 2 MG/2ML IJ SOLN
INTRAMUSCULAR | Status: AC
  Filled 2013-12-04: qty 2

## 2013-12-04 MED ORDER — ACETAMINOPHEN 325 MG PO TABS: 650.0000 mg | ORAL_TABLET | Freq: Once | ORAL | Status: DC

## 2013-12-04 MED ORDER — SODIUM CHLORIDE 0.9 % IV SOLN: INTRAVENOUS | Status: DC

## 2013-12-04 MED ORDER — ONDANSETRON HCL 4 MG PO TABS: 4.0000 mg | ORAL_TABLET | Freq: Four times a day (QID) | ORAL | Status: DC | PRN

## 2013-12-04 MED ORDER — EPHEDRINE SULFATE 50 MG/ML IJ SOLN
INTRAMUSCULAR | Status: DC | PRN
  Administered 2013-12-04 (×3): 10 mg via INTRAVENOUS

## 2013-12-04 MED ORDER — OXYCODONE-ACETAMINOPHEN 5-325 MG PO TABS: 1.0000 | ORAL_TABLET | ORAL | Status: DC | PRN

## 2013-12-04 MED ORDER — LIDOCAINE HCL (CARDIAC) 20 MG/ML IV SOLN
INTRAVENOUS | Status: DC | PRN
  Administered 2013-12-04: 30 mg via INTRAVENOUS

## 2013-12-04 MED ORDER — KETOROLAC TROMETHAMINE 30 MG/ML IJ SOLN
30.0000 mg | Freq: Once | INTRAMUSCULAR | Status: AC
  Administered 2013-12-04: 30 mg via INTRAVENOUS
  Filled 2013-12-04: qty 1

## 2013-12-04 MED ORDER — LACTATED RINGERS IV BOLUS (SEPSIS)
1000.0000 mL | Freq: Once | INTRAVENOUS | Status: AC
  Administered 2013-12-04: 1000 mL via INTRAVENOUS

## 2013-12-04 MED ORDER — LACTATED RINGERS IV SOLN
INTRAVENOUS | Status: DC | PRN
  Administered 2013-12-04 (×3): via INTRAVENOUS

## 2013-12-04 MED ORDER — DIPHENHYDRAMINE HCL 25 MG PO CAPS: 25.0000 mg | ORAL_CAPSULE | Freq: Once | ORAL | Status: DC

## 2013-12-04 MED ORDER — FENTANYL CITRATE 0.05 MG/ML IJ SOLN: 25.0000 ug | INTRAMUSCULAR | Status: DC | PRN

## 2013-12-04 MED ORDER — PROPOFOL 10 MG/ML IV EMUL
INTRAVENOUS | Status: DC | PRN
  Administered 2013-12-04 (×3): 50 mg via INTRAVENOUS
  Administered 2013-12-04: 100 mg via INTRAVENOUS

## 2013-12-04 MED ORDER — KETOROLAC TROMETHAMINE 30 MG/ML IJ SOLN
INTRAMUSCULAR | Status: AC
  Filled 2013-12-04: qty 1

## 2013-12-04 MED ORDER — IBUPROFEN 600 MG PO TABS: 600.0000 mg | ORAL_TABLET | Freq: Once | ORAL | Status: DC

## 2013-12-04 MED ORDER — FENTANYL CITRATE 0.05 MG/ML IJ SOLN
INTRAMUSCULAR | Status: AC
  Filled 2013-12-04: qty 2

## 2013-12-04 MED ORDER — ONDANSETRON HCL 4 MG/2ML IJ SOLN
INTRAMUSCULAR | Status: AC
  Filled 2013-12-04: qty 2

## 2013-12-04 MED ORDER — ONDANSETRON HCL 4 MG/2ML IJ SOLN: 4.0000 mg | Freq: Once | INTRAMUSCULAR | Status: DC | PRN

## 2013-12-04 MED ORDER — LACTATED RINGERS IV SOLN: INTRAVENOUS | Status: DC

## 2013-12-04 MED ORDER — MEPERIDINE HCL 25 MG/ML IJ SOLN: 6.2500 mg | INTRAMUSCULAR | Status: DC | PRN

## 2013-12-04 MED ORDER — CEFAZOLIN SODIUM-DEXTROSE 2-3 GM-% IV SOLR
2.0000 g | Freq: Once | INTRAVENOUS | Status: AC
  Administered 2013-12-04: 2 g via INTRAVENOUS

## 2013-12-04 SURGICAL SUPPLY — 21 items
CATH ROBINSON RED A/P 16FR (CATHETERS) ×3 IMPLANT
CLOTH BEACON ORANGE TIMEOUT ST (SAFETY) ×3 IMPLANT
DECANTER SPIKE VIAL GLASS SM (MISCELLANEOUS) ×3 IMPLANT
GLOVE BIOGEL PI IND STRL 7.0 (GLOVE) ×1 IMPLANT
GLOVE BIOGEL PI INDICATOR 7.0 (GLOVE) ×2
GOWN STRL REUS W/TWL LRG LVL3 (GOWN DISPOSABLE) ×6 IMPLANT
KIT BERKELEY 1ST TRIMESTER 3/8 (MISCELLANEOUS) ×3 IMPLANT
NEEDLE SPNL 22GX3.5 QUINCKE BK (NEEDLE) ×3 IMPLANT
NS IRRIG 1000ML POUR BTL (IV SOLUTION) ×3 IMPLANT
PACK VAGINAL MINOR WOMEN LF (CUSTOM PROCEDURE TRAY) ×3 IMPLANT
PAD OB MATERNITY 4.3X12.25 (PERSONAL CARE ITEMS) ×3 IMPLANT
PAD PREP 24X48 CUFFED NSTRL (MISCELLANEOUS) ×3 IMPLANT
SET BERKELEY SUCTION TUBING (SUCTIONS) ×3 IMPLANT
SUT VIC AB 3-0 SH 27 (SUTURE) ×2
SUT VIC AB 3-0 SH 27XBRD (SUTURE) ×1 IMPLANT
SYR CONTROL 10ML LL (SYRINGE) ×3 IMPLANT
TOWEL OR 17X24 6PK STRL BLUE (TOWEL DISPOSABLE) ×3 IMPLANT
VACURETTE 10 RIGID CVD (CANNULA) ×3 IMPLANT
VACURETTE 7MM CVD STRL WRAP (CANNULA) IMPLANT
VACURETTE 8 RIGID CVD (CANNULA) IMPLANT
VACURETTE 9 RIGID CVD (CANNULA) IMPLANT

## 2013-12-04 NOTE — MAU Note (Signed)
Abdominal ultrasound completed. Assisted to bathroom. Voided qs without difficulty. Blood-tinged urine with 2-3 small clots noted in toilet. Assisted to bed. Transvaginal ultrasound in progress.

## 2013-12-04 NOTE — Op Note (Signed)
Preoperative diagnosis: Incomplete AB with active bleeding  Postoperative diagnosis: Same  Anesthesia: IV sedation and paracervical block  Anesthesiologist: Dr. Jillyn Hidden  Procedure: Dilatation and evacuation  Surgeon: Dr. Katharine Look Jeniel Slauson  Estimated blood loss: Minimal but overall estimated at 600-700 cc  Procedure:  After being informed of the planned procedure with possible complications including bleeding, infection and injury to uterus, informed consent is obtained and patient is taken to or #4. She is placed in the lithotomy position and given IV sedation without complication. She is then prepped and draped in a sterile  fashion and her bladder is emptied with an in and out red rubber catheter.  Pelvic exam reveals a retroverted  uterus compatible with [redacted] weeks gestation. Both adnexa are felt and normal. Cervix is open.  A weighted speculum is inserted in the vagina and the anterior lip of the cervix was grasped with a tenaculum forcep. We proceed with a paracervical block using 20 cc of Nesacaine 1%. The uterus was then sounded at 9 cm. The cervix  easily allows passage of Hegar dilator  #31. Using a #10 curved cannula, we proceed with evacuation of products of conception without difficulty. We then proceed with sharp curettage of the uterine cavity to confirm complete evacuation.  Instruments are then removed. Instrument and sponge count is complete x2.   Estimated blood loss is minimal.  The procedure is very well tolerated by the patient is taken to recovery room in a well and stable condition.  Specimen: Products of conception sent to pathology

## 2013-12-04 NOTE — Progress Notes (Signed)
12/04/13 1836  Vitals  Temp 97.8 F (36.6 C)  Temp src Oral  Pulse Rate ! 103  ECG Heart Rate ! 104  Resp 18  BP 103/60 mmHg  Oxygen Therapy  SpO2 100 %  unit #1 completed of 335cc. Unit #2 of PRBC  [id number] O-  hung by Marco Collie, CRNA. Verified by Lucy Antigua, RN infused at a rapid rate through  the blood warmer and pressure bag.

## 2013-12-04 NOTE — MAU Note (Signed)
Phlebotomist in to draw blood.

## 2013-12-04 NOTE — Telephone Encounter (Signed)
TC from pt, approx 10wks, w miscarriage, was seen in office on Fri by DR Simona Huh, [place] measured 6wks. Pt reports bleeding increased about 2am, and since 530am passed some clots and had an episode of feeling faint. Now feeling ok, tol PO, rv'd bleeding precautions, hydration, and sm frequent snacks. Emotional support given, instructed to call if further questions.

## 2013-12-04 NOTE — Transfer of Care (Signed)
Immediate Anesthesia Transfer of Care Note  Patient: [name]  Procedure(s) Performed: Procedure(s): DILATATION AND EVACUATION (N/A)  Patient Location: PACU  Anesthesia Type:MAC  Level of Consciousness: awake, alert  and oriented  Airway & Oxygen Therapy: Patient Spontanous Breathing and Patient connected to face mask oxygen  Post-op Assessment: Report given to PACU RN and Post -op Vital signs reviewed and stable  Post vital signs: Reviewed and stable  Complications: No apparent anesthesia complications

## 2013-12-04 NOTE — Anesthesia Postprocedure Evaluation (Signed)
Anesthesia Post Note  Patient: [name]  Procedure(s) Performed: Procedure(s) (LRB): DILATATION AND EVACUATION (N/A)  Anesthesia type: MAC  Patient location: PACU  Post pain: Pain level controlled  Post assessment: Post-op Vital signs reviewed  Last Vitals:  Filed Vitals:   12/04/13 1531  BP: 107/61  Pulse: 102  Temp:   Resp: 18    Post vital signs: Reviewed  Level of consciousness: sedated  Complications: No apparent anesthesia complications

## 2013-12-04 NOTE — MAU Note (Signed)
Patient presents with complaint of spotting that started on Wednesday that progressed to heavy bleeding at 0145 today. Patient states that U/S on Thursday and BHCG levels suggest a miscarriage.

## 2013-12-04 NOTE — Anesthesia Preprocedure Evaluation (Signed)
Anesthesia Evaluation  Patient identified by MRN, date of birth, ID band Patient awake    Reviewed: Allergy & Precautions, H&P , NPO status , Patient's Chart, lab work & pertinent test results  Airway Mallampati: I TM Distance: >3 FB Neck ROM: full    Dental  (+) Teeth Intact   Pulmonary neg pulmonary ROS,          Cardiovascular     Neuro/Psych negative neurological ROS  negative psych ROS   GI/Hepatic negative GI ROS, Neg liver ROS,   Endo/Other    Renal/GU negative Renal ROS     Musculoskeletal   Abdominal   Peds  Hematology negative hematology ROS (+)   Anesthesia Other Findings   Reproductive/Obstetrics negative OB ROS                           Anesthesia Physical Anesthesia Plan  ASA: I  Anesthesia Plan: MAC   Post-op Pain Management:    Induction: Intravenous  Airway Management Planned:   Additional Equipment:   Intra-op Plan:   Post-operative Plan:   Informed Consent: I have reviewed the patients History and Physical, chart, labs and discussed the procedure including the risks, benefits and alternatives for the proposed anesthesia with the patient or authorized representative who has indicated his/her understanding and acceptance.     Plan Discussed with: CRNA and Surgeon  Anesthesia Plan Comments:         Anesthesia Quick Evaluation

## 2013-12-04 NOTE — MAU Note (Signed)
Attempt to assist patient to the bathroom unsuccessful. Patient became dizzy and nauseated and had to be returned to a lying position. IVF's increased to bolus rate.

## 2013-12-04 NOTE — Progress Notes (Signed)
Called by RN at MAU to inform me that patient became hypotensive 50/20 after standing with a normal HR. Bleeding has been minimal during this last hour until patient was able to use bedpan and passed a 6 cm clot. Met with patient and husband and recommended to proceed with D&E and to keep patient for 23 hour observation. Transfusion reviewed with R&B. Although Hgb is 9.3, I suspect it is going to settle much lower. Will evaluate need for transfusion during surgery or in PACU. Will order 2 units of PRC to be prepared.  Patient agreeable with plan

## 2013-12-04 NOTE — MAU Note (Signed)
Patient voided qs without difficulty on bedpan and passed a 6cm clot.

## 2013-12-04 NOTE — MAU Note (Signed)
Patient voided qs on bedpan; felt too "woosy" to travel to bathroom via W/C.

## 2013-12-04 NOTE — Progress Notes (Signed)
12/04/13 1820  Vitals  Temp 98.2 F (36.8 C)  Temp src Oral  Pulse Rate ! 102  ECG Heart Rate ! 105  Resp 14  BP ! 104/52 mmHg  Oxygen Therapy  SpO2 98 %  O2 Device None (Room air)  Marco Collie, CRNA hung unit #1 PRBC  [id number] O+  verifed by Lucy Antigua, RN. Infused with blood warmer  with a pressure bag .

## 2013-12-04 NOTE — Progress Notes (Signed)
12/04/13 1900  Vitals  Temp 98.3 F (36.8 C)  Temp src Oral  Pulse Rate 100  ECG Heart Rate ! 101  Resp 17  BP ! 109/57 mmHg  Oxygen Therapy  SpO2 100 %  SpO2 Alarm Limit Low ! 90  Intake (mL)  NS for Blood Administration 500 mL  #2 PRBC bag [id number]  completed  of 335cc. Marland Kitchen No reaction noted. Vital signs remained stable with the 2 transfusions.

## 2013-12-04 NOTE — Addendum Note (Signed)
Addendum created 12/04/13 1841 by Lenox Ponds, CRNA   Modules edited: Anesthesia LDA

## 2013-12-04 NOTE — MAU Provider Note (Addendum)
History     CSN: [id number]  Arrival date and time: 12/04/13 1601   First Provider Initiated Contact with Patient 12/04/13 [phone]      Chief Complaint  Patient presents with  . Vaginal Bleeding  . Dizziness  . Loss of Consciousness   HPI  Ms. [name] is a 33 y.o. female G2P1001 at [redacted]w[redacted]d. Pt has a known missed AB; diagnosed by her primary Dr. This week. She was seen in the office on Monday where they were unable to doppler fetal heart tones. She had an [place] on Thursday; fetus was measuring small. Pt started spotting on Thursday, bleeding on Friday. She went to the office on Friday and spoke to Dr. Simona Garcia about the plan of care; expectant management was chosen by patient and her husband.  Pt continued to bleed over the weekend, with heavy bleeding that started this morning at 0145. Pt is having pain currently; she rates her pain 6-7. Patient has not taken anything for pain. Pt had two witnessed syncopal episodes at home; pt recalls feeling lightheaded, however does not recall "passing out". Patients husband was with her during this time.    OB History   Grav Para Term Preterm Abortions TAB SAB Ect Mult Living   2 1 1  0 0 0 0 0 0 1      Past Medical History  Diagnosis Date  . Pregnancy induced hypertension   . Gestational diabetes     Past Surgical History  Procedure Laterality Date  . Cesarean section  07/20/2012    Procedure: CESAREAN SECTION;  Surgeon: Frances Lose, MD;  Location: [facility];  Service: Obstetrics;  Laterality: N/A;  Primary Cesarean Section Delivery Boy @  [phone], Apgars 9/9    History reviewed. No pertinent family history.  History  Substance Use Topics  . Smoking status: Never Smoker   . Smokeless tobacco: Never Used  . Alcohol Use: No    Allergies: No Known Allergies  Prescriptions prior to admission  Medication Sig Dispense Refill  . OVER THE COUNTER MEDICATION Take 1 tablet by mouth daily.      . Pediatric Multiple Vit-C-FA (PEDIATRIC  MULTIVITAMIN) chewable tablet Chew 2 tablets by mouth daily.        Results for orders placed during the hospital encounter of 12/04/13 (from the past 48 hour(s))  CBC     Status: Abnormal   Collection Time    12/04/13  9:37 AM      Result Value Ref Range   WBC 14.6 (*) 4.0 - 10.5 K/uL   RBC 3.64 (*) 3.87 - 5.11 MIL/uL   Hemoglobin 10.9 (*) 12.0 - 15.0 g/dL   HCT 30.8 (*) 36.0 - 46.0 %   MCV 84.6  78.0 - 100.0 fL   MCH 29.9  26.0 - 34.0 pg   MCHC 35.4  30.0 - 36.0 g/dL   RDW 12.7  11.5 - 15.5 %   Platelets 164  150 - 400 K/uL  TYPE AND SCREEN     Status: None   Collection Time    12/04/13 10:19 AM      Result Value Ref Range   ABO/RH(D) O POS     Antibody Screen NEG     Sample Expiration 12/07/2013      Review of Systems  Constitutional: Positive for malaise/fatigue. Negative for fever and chills.  Gastrointestinal: Positive for abdominal pain (Lower abdominal cramping ).  Genitourinary:       No vaginal discharge. + vaginal bleeding; heavy  No dysuria.   Neurological: Positive for dizziness and weakness.   Physical Exam   Blood pressure 107/58, pulse 86, temperature 97.3 F (36.3 C), temperature source Oral, resp. rate 18, height 5\' 3"  (1.6 m), weight 64.864 kg (143 lb), last menstrual period 09/16/2013, not currently breastfeeding.  Physical Exam  Constitutional: She is oriented to person, place, and time. She appears well-developed and well-nourished. She appears distressed.  HENT:  Head: Normocephalic.  Eyes: Pupils are equal, round, and reactive to light.  Neck: Neck supple.  Respiratory: Effort normal.  GI: Soft.  Genitourinary:  Speculum exam: Vagina - Large amount of bright red blood pooling in the vaginal canal.  Cervix -Large active bleeding. Golf ball size clot at cervical os. Ring forceps used to retreive clot.  Bimanual exam: Cervix 1 cm  Chaperone present for exam.   Musculoskeletal: Normal range of motion.  Neurological: She is alert and  oriented to person, place, and time.  Skin: There is pallor.  Psychiatric: Her behavior is normal. Thought content normal.    MAU Course  Procedures None  MDM 1020: consulted with Dr. Cletis Garcia with concerns regarding patients heavy vaginal bleeding. Labs pending, IV started, Stat bedside US ordered.  O positive blood type.  Type and screen CBC IV started and intact Dr. Cletis Garcia on her way to MAU to evaluate the patient. Pt and husband made aware. Questions answered.  Toradol 30 mg IVP given for abdominal cramping.   Assessment and Plan   Dr. Cletis Garcia at the bedside discussing plan of care with the patient and her husband.   Frances Hillock Rasch, NP 12/04/2013, 1:01 PM   Reviewed findings with patient and husband. Currently reports cramping with back pain 5/10. Recommend repeat speculum exam to possibly remove POC from cervix. If unsuccessful, would recommend to proceed with D&E: procedure, R&B and recovery discussed.  Speculum exam: large clots removed from vagina: 100 cc                             Cervical os with gestational sac visualized and easily pulled. Observed bleeding for a few minutes which was light.                             Patient cleaned and new pad given  Plan: will observe for 1-2 hours. If no heavy bleeding and patient able to ambulate and void, will D/C home with bleeding precautions. Patient instructed to schedule follow-up appointment with Dr [name] in 1-2 weeks. POC sent to pathology with copy of report to Dr [name]. Patient and husband agreeable with plan.  [name] 12/04/2013, 1:35 PM

## 2013-12-04 NOTE — MAU Note (Signed)
Dudley Major, U/S tech in to perform bedside ultrasound.

## 2013-12-05 ENCOUNTER — Encounter (HOSPITAL_COMMUNITY): Payer: Self-pay | Admitting: Obstetrics and Gynecology

## 2013-12-05 LAB — TYPE AND SCREEN
ABO/RH(D): O POS
Antibody Screen: NEGATIVE
UNIT DIVISION: 0
UNIT DIVISION: 0
Unit division: 0
Unit division: 0

## 2013-12-05 MED ORDER — IBUPROFEN 600 MG PO TABS: 600.0000 mg | ORAL_TABLET | Freq: Four times a day (QID) | ORAL | Status: DC | PRN

## 2013-12-05 NOTE — Discharge Instructions (Signed)
Dilation and Curettage or Vacuum Curettage, Care After  Refer to this sheet in the next few weeks. These instructions provide you with information on caring for yourself after your procedure. Your health care provider may also give you more specific instructions. Your treatment has been planned according to current medical practices, but problems sometimes occur. Call your health care provider if you have any problems or questions after your procedure.  WHAT TO EXPECT AFTER THE PROCEDURE  After your procedure, it is typical to have light cramping and bleeding. This may last for 2 days to 2 weeks after the procedure.  HOME CARE INSTRUCTIONS   · Do not drive for 24 hours.  · Wait 1 week before returning to strenuous activities.  · Take your temperature 2 times a day for 4 days and write it down. Provide these temperatures to your health care provider if you develop a fever.  · Avoid long periods of standing.  · Avoid heavy lifting, pushing, or pulling. Do not lift anything heavier than 10 pounds (4.5 kg).  · Limit stair climbing to once or twice a day.  · Take rest periods often.  · You may resume your usual diet.  · Drink enough fluids to keep your urine clear or pale yellow.  · Your usual bowel function should return. If you have constipation, you may:  · Take a mild laxative with permission from your health care provider.  · Add fruit and bran to your diet.  · Drink more fluids.  · Take showers instead of baths until your health care provider gives you permission to take baths.  · Do not go swimming or use a hot tub until your health care provider approves.  · Try to have someone with you or available to you the first 24 48 hours, especially if you were given a general anesthetic.  · Do not douche, use tampons, or have intercourse for 2 weeks after the procedure.  · Only take over-the-counter or prescription medicines as directed by your health care provider. Do not take aspirin. It can cause bleeding.  · Follow up  with your health care provider as directed.  SEEK MEDICAL CARE IF:   · You have increasing cramps or pain that is not relieved with medicine.  · You have abdominal pain that does not seem to be related to the same area of earlier cramping and pain.  · You have bad smelling vaginal discharge.  · You have a rash.  · You are having problems with any medicine.  SEEK IMMEDIATE MEDICAL CARE IF:   · You have bleeding that is heavier than a normal menstrual period.  · You have a fever.  · You have chest pain.  · You have shortness of breath.  · You feel dizzy or feel like fainting.  · You pass out.  · You have pain in your shoulder strap area.  · You have heavy vaginal bleeding with or without blood clots.  Document Released: 08/01/2000 Document Revised: 05/25/2013 Document Reviewed: 03/03/2013  Exit[employer]® Patient Information ©2014 [employer].

## 2013-12-05 NOTE — Discharge Summary (Signed)
Physician Discharge Summary  Patient ID: [name] MRN: [id number] DOB/AGE: 1980-09-30 33 y.o.  Admit date: 12/04/2013 Discharge date: 12/05/2013  Admission Diagnoses:  Incomplete abortion  Discharge Diagnoses:S/p D&C, hemorrhage Active Problems:   SAB (spontaneous abortion)   Discharged Condition: improved  Hospital Course: Pt presented to MAU with heavy vaginal bleeding s/p 2 syncopal episodes at home.  Gestational sac removed in MAU.  Due to hypotension and near syncope, D&C done.  2 units PRBCs given.  Hg ~9.6 down to 7.1.  Post 9.6  Consults: None  Significant Diagnostic Studies: labs: see above  Treatments: Transfusiion of 2 units PRBCs.  Discharge Exam: Pt states bleeding is pink spotting.  No pain meds required.  Feels back to normal.  Blood pressure 96/51, pulse 89, temperature 98.3 F (36.8 C), temperature source Oral, resp. rate 18, height 5\' 3"  (1.6 m), weight 64.864 kg (143 lb), last menstrual period 09/16/2013, SpO2 100.00%, not currently breastfeeding. Gen:  NAD CV:  RRR Lungs:  CTA bilaterally Abdomen:  Soft, nontender Ext:  SCDs on.  No calf tenderness.  Disposition: 01-Home or Self Care     Medication List         ibuprofen 600 MG tablet  Commonly known as:  ADVIL,MOTRIN  Take 1 tablet (600 mg total) by mouth every 6 (six) hours as needed (mild pain).     OVER THE COUNTER MEDICATION  Take 1 tablet by mouth daily.     pediatric multivitamin chewable tablet  Chew 2 tablets by mouth daily.           Follow-up Information   Follow up with Thurnell Lose, MD. Schedule an appointment as soon as possible for a visit in 2 weeks. (Post op f/u)    Specialty:  Obstetrics and Gynecology   Contact information:   [address], [place] [address] [phone]       Signed: Thurnell Lose 12/05/2013, 8:40 AM

## 2013-12-05 NOTE — Anesthesia Postprocedure Evaluation (Signed)
  Anesthesia Post-op Note  Patient: [name]  Procedure(s) Performed: Procedure(s): DILATATION AND EVACUATION (N/A)  Patient Location: [facility]  Anesthesia Type:MAC  Level of Consciousness: awake, alert , oriented and patient cooperative  Airway and Oxygen Therapy: Patient Spontanous Breathing  Post-op Pain: mild  Post-op Assessment: Patient's Cardiovascular Status Stable, Respiratory Function Stable, Patent Airway, No signs of Nausea or vomiting, Adequate PO intake and Pain level controlled  Post-op Vital Signs: Reviewed and stable  Last Vitals:  Filed Vitals:   12/05/13 0518  BP: 96/51  Pulse: 89  Temp: 36.8 C  Resp: 18    Complications: No apparent anesthesia complications

## 2013-12-05 NOTE — Addendum Note (Signed)
Addendum created 12/05/13 4287 by Brock Ra, CRNA   Modules edited: Charges VN, Notes Section   Notes Section:  File: [id number]

## 2013-12-05 NOTE — Progress Notes (Signed)
Pt discharged home with husband... Condition stable... No equipment... Ambulated to car with V. Pittman, NT.  

## 2013-12-05 NOTE — Progress Notes (Signed)
Post discharge review completed. 

## 2014-06-19 ENCOUNTER — Encounter (HOSPITAL_COMMUNITY): Payer: Self-pay | Admitting: Obstetrics and Gynecology

## 2014-08-18 DIAGNOSIS — N200 Calculus of kidney: Secondary | ICD-10-CM

## 2014-08-18 HISTORY — DX: Calculus of kidney: N20.0

## 2014-09-01 LAB — OB RESULTS CONSOLE ANTIBODY SCREEN: Antibody Screen: NEGATIVE

## 2014-09-01 LAB — OB RESULTS CONSOLE HIV ANTIBODY (ROUTINE TESTING): HIV: NONREACTIVE

## 2014-09-01 LAB — OB RESULTS CONSOLE GC/CHLAMYDIA
CHLAMYDIA, DNA PROBE: NEGATIVE
Gonorrhea: NEGATIVE

## 2014-09-01 LAB — OB RESULTS CONSOLE ABO/RH: RH Type: POSITIVE

## 2014-09-01 LAB — OB RESULTS CONSOLE HEPATITIS B SURFACE ANTIGEN: Hepatitis B Surface Ag: NEGATIVE

## 2014-09-01 LAB — OB RESULTS CONSOLE RUBELLA ANTIBODY, IGM: Rubella: IMMUNE

## 2014-09-01 LAB — OB RESULTS CONSOLE RPR: RPR: NONREACTIVE

## 2014-09-11 ENCOUNTER — Other Ambulatory Visit (HOSPITAL_COMMUNITY)
Admission: RE | Admit: 2014-09-11 | Discharge: 2014-09-11 | Disposition: A | Discharge status: Home / Self Care | Payer: 59 | Source: Ambulatory Visit | Attending: Obstetrics and Gynecology | Admitting: Obstetrics and Gynecology

## 2014-09-11 ENCOUNTER — Other Ambulatory Visit: Payer: Self-pay | Admitting: Obstetrics and Gynecology

## 2014-09-11 DIAGNOSIS — Z01419 Encounter for gynecological examination (general) (routine) without abnormal findings: Secondary | ICD-10-CM | POA: Insufficient documentation

## 2014-09-11 DIAGNOSIS — Z1151 Encounter for screening for human papillomavirus (HPV): Secondary | ICD-10-CM | POA: Diagnosis present

## 2014-09-13 LAB — CYTOLOGY - PAP

## 2014-10-09 ENCOUNTER — Encounter (HOSPITAL_COMMUNITY): Payer: Self-pay | Admitting: *Deleted

## 2015-03-06 ENCOUNTER — Ambulatory Visit
Admission: RE | Admit: 2015-03-06 | Discharge: 2015-03-06 | Disposition: A | Discharge status: Home / Self Care | Payer: 59 | Source: Ambulatory Visit | Attending: Obstetrics and Gynecology | Admitting: Obstetrics and Gynecology

## 2015-03-06 ENCOUNTER — Other Ambulatory Visit: Payer: Self-pay | Admitting: Obstetrics and Gynecology

## 2015-03-06 DIAGNOSIS — R319 Hematuria, unspecified: Secondary | ICD-10-CM

## 2015-03-08 ENCOUNTER — Telehealth: Payer: Self-pay | Admitting: Obstetrics and Gynecology

## 2015-03-08 NOTE — Telephone Encounter (Signed)
TC from pt on 03/06/15. States was diagnosed with kidney stones and prescribed Percocet. Began hurting at 4:30 PM and wanted to see if regular strength Tylenol would dull her pain before trying the Percocet. Asked if it was okay to take a Percocet (had been about 6 hrs since she took Tylenol) and wanted to know when would stone likely pass. Advised that she could go ahead and take a Percocet and no timeframe could be given as to when she would pass stone. Questions answered to pt's satisfaction. Was advised to f/u as needed.

## 2015-03-30 ENCOUNTER — Encounter (HOSPITAL_COMMUNITY): Payer: Self-pay

## 2015-04-02 ENCOUNTER — Encounter (HOSPITAL_COMMUNITY)
Admission: RE | Admit: 2015-04-02 | Discharge: 2015-04-02 | Disposition: A | Discharge status: Home / Self Care | Payer: 59 | Source: Ambulatory Visit | Attending: Obstetrics and Gynecology | Admitting: Obstetrics and Gynecology

## 2015-04-02 ENCOUNTER — Encounter (HOSPITAL_COMMUNITY): Payer: Self-pay

## 2015-04-02 HISTORY — DX: Hypothyroidism, unspecified: E03.9

## 2015-04-02 HISTORY — DX: Calculus of kidney: N20.0

## 2015-04-02 LAB — CBC
HCT: 31.3 % — ABNORMAL LOW (ref 36.0–46.0)
Hemoglobin: 10.7 g/dL — ABNORMAL LOW (ref 12.0–15.0)
MCH: 30.1 pg (ref 26.0–34.0)
MCHC: 34.2 g/dL (ref 30.0–36.0)
MCV: 88.2 fL (ref 78.0–100.0)
Platelets: 129 10*3/uL — ABNORMAL LOW (ref 150–400)
RBC: 3.55 MIL/uL — ABNORMAL LOW (ref 3.87–5.11)
RDW: 13.6 % (ref 11.5–15.5)
WBC: 12.2 10*3/uL — ABNORMAL HIGH (ref 4.0–10.5)

## 2015-04-02 LAB — TYPE AND SCREEN
ABO/RH(D): O POS
Antibody Screen: NEGATIVE

## 2015-04-02 NOTE — Pre-Procedure Instructions (Signed)
Dr. Tresa Moore notified of platelets results, 129. He wants platelets repeated on day of surgery.

## 2015-04-02 NOTE — Patient Instructions (Signed)
Your procedure is scheduled on:04/04/15  Enter through the Main Entrance at :7am Pick up desk phone and dial [phone] and inform us of your arrival.  Please call [phone] if you have any problems the morning of surgery.  Remember: Do not eat food or drink liquids, including water, after midnight:Tuesday   You may brush your teeth the morning of surgery.  Take these meds the morning of surgery with a sip of water: thyroid pill and Zantac  DO NOT wear jewelry, eye make-up, lipstick,body lotion, or dark fingernail polish.  (Polished toes are ok) You may wear deodorant.  If you are to be admitted after surgery, leave suitcase in car until your room has been assigned. Patients discharged on the day of surgery will not be allowed to drive home. Wear loose fitting, comfortable clothes for your ride home.

## 2015-04-03 LAB — RPR: RPR Ser Ql: NONREACTIVE

## 2015-04-03 MED ORDER — CEFAZOLIN SODIUM-DEXTROSE 2-3 GM-% IV SOLR
2.0000 g | INTRAVENOUS | Status: AC
  Administered 2015-04-04: 2 g via INTRAVENOUS

## 2015-04-03 NOTE — H&P (Signed)
[name] is a 34 y.o. female G2 P1001 at 19 6/7 weeks c/w a 9 week ultrasound presenting for repeat cesarean section due to declined VBAC.  Previous cesarean section due to CPD.  Pregnancy was complicated by passage of a kidney stone ~ 3 weeks ago.  Pt has also been on Synthroid for hypothyroidism diagnosed at the beginning of the pregnancy.  Pt was seen by urology.  Pt has had prenatal care with [facility] Simona Huh).  Maternal Medical History:  Reason for admission: Nausea.  Contractions: Frequency: rare.    Fetal activity: Perceived fetal activity is normal.    Prenatal complications: no prenatal complications Prenatal Complications - Diabetes: none.    OB History    Gravida Para Term Preterm AB TAB SAB Ectopic Multiple Living   3 1 1  0 0 0 0 0 0 1     Past Medical History  Diagnosis Date  . Hypothyroidism   . Gestational diabetes 2003  . Pregnancy induced hypertension 2003  . Kidney stone 2016    passed it- July    Past Surgical History  Procedure Laterality Date  . Cesarean section  07/20/2012    Procedure: CESAREAN SECTION; X1 Surgeon: Thurnell Lose, MD;  Location: [facility];  Service: Obstetrics;  Laterality: N/A;  Primary Cesarean Section Delivery Boy @  [phone], Apgars 9/9  . Dilation and evacuation N/A 12/04/2013    Procedure: DILATATION AND EVACUATION;  Surgeon: Alwyn Pea, MD;  Location: [facility];  Service: Gynecology;  Laterality: N/A;   Family History: family history is not on file. Social History:  reports that she has never smoked. She has never used smokeless tobacco. She reports that she does not drink alcohol or use illicit drugs.   Prenatal Transfer Tool  Maternal Diabetes: No Genetic Screening: Declined Maternal Ultrasounds/Referrals: Normal Fetal Ultrasounds or other Referrals:  None Maternal Substance Abuse:  No Significant Maternal Medications:  Meds include: Syntroid Significant Maternal Lab Results:  Lab values include: Group B Strep  negative Other Comments:  None  Review of Systems  Constitutional: Negative for fever and chills.  Respiratory: Negative for shortness of breath.   Cardiovascular: Negative for chest pain.  Gastrointestinal: Negative for nausea, vomiting and abdominal pain.      unknown if currently breastfeeding. Maternal Exam:  Abdomen: Patient reports no abdominal tenderness. Estimated fetal weight is 8 lbs.   Fetal presentation: vertex  Introitus: not evaluated.     Fetal Exam Fetal Monitor Review: Mode: hand-held doppler probe.   Baseline rate: 140s.      Physical Exam  Constitutional: She is oriented to person, place, and time. She appears well-developed and well-nourished. No distress.  HENT:  Head: Atraumatic.  Eyes: EOM are normal.  Neck: Normal range of motion.  Cardiovascular: Normal rate and regular rhythm.   Respiratory: Effort normal and breath sounds normal. No respiratory distress.  Musculoskeletal: She exhibits edema. She exhibits no tenderness.  Neurological: She is alert and oriented to person, place, and time.  Skin: Skin is warm and dry.  Psychiatric: She has a normal mood and affect.    Prenatal labs: ABO, Rh: --/--/O POS (08/15 1540) Antibody: NEG (08/15 1540) Rubella: Immune (01/15 0000) RPR: Non Reactive (08/15 1540)  HBsAg: Negative (01/15 0000)  HIV: Non-reactive (01/15 0000)  GBS:   Negative  Assessment/Plan: IUP at 38 6/7 weeks H/o previous cesarean section, declines VBAC. Hypothyroidism well controlled on Synthroid. H/o recent kidney stone.  Repeat LTCS.  R/B/A reviewed.  All questions answered.  Thurnell Lose 04/03/2015, 11:44 PM

## 2015-04-04 ENCOUNTER — Inpatient Hospital Stay (HOSPITAL_COMMUNITY): Payer: 59 | Admitting: Anesthesiology

## 2015-04-04 ENCOUNTER — Encounter (HOSPITAL_COMMUNITY): Payer: Self-pay

## 2015-04-04 ENCOUNTER — Encounter (HOSPITAL_COMMUNITY)
Admission: RE | Disposition: A | Discharge status: Home / Self Care | Payer: Self-pay | Source: Ambulatory Visit | Attending: Obstetrics and Gynecology

## 2015-04-04 ENCOUNTER — Inpatient Hospital Stay (HOSPITAL_COMMUNITY)
Admission: RE | Admit: 2015-04-04 | Discharge: 2015-04-06 | DRG: 766 | Disposition: A | Discharge status: Home / Self Care | Payer: 59 | Source: Ambulatory Visit | Attending: Obstetrics and Gynecology | Admitting: Obstetrics and Gynecology

## 2015-04-04 DIAGNOSIS — Z3A39 39 weeks gestation of pregnancy: Secondary | ICD-10-CM | POA: Diagnosis present

## 2015-04-04 DIAGNOSIS — O3421 Maternal care for scar from previous cesarean delivery: Principal | ICD-10-CM | POA: Diagnosis present

## 2015-04-04 DIAGNOSIS — L910 Hypertrophic scar: Secondary | ICD-10-CM | POA: Diagnosis present

## 2015-04-04 DIAGNOSIS — Z3493 Encounter for supervision of normal pregnancy, unspecified, third trimester: Secondary | ICD-10-CM

## 2015-04-04 DIAGNOSIS — E039 Hypothyroidism, unspecified: Secondary | ICD-10-CM | POA: Diagnosis present

## 2015-04-04 DIAGNOSIS — O3663X0 Maternal care for excessive fetal growth, third trimester, not applicable or unspecified: Secondary | ICD-10-CM | POA: Diagnosis present

## 2015-04-04 DIAGNOSIS — O99284 Endocrine, nutritional and metabolic diseases complicating childbirth: Secondary | ICD-10-CM | POA: Diagnosis present

## 2015-04-04 DIAGNOSIS — Z87442 Personal history of urinary calculi: Secondary | ICD-10-CM

## 2015-04-04 DIAGNOSIS — Z98891 History of uterine scar from previous surgery: Secondary | ICD-10-CM

## 2015-04-04 HISTORY — PX: SCAR REVISION: SHX5285

## 2015-04-04 LAB — PLATELET COUNT: PLATELETS: 142 10*3/uL — AB (ref 150–400)

## 2015-04-04 SURGERY — Surgical Case
Anesthesia: Spinal | Site: Abdomen

## 2015-04-04 MED ORDER — SIMETHICONE 80 MG PO CHEW
80.0000 mg | CHEWABLE_TABLET | Freq: Three times a day (TID) | ORAL | Status: DC
  Administered 2015-04-04 – 2015-04-06 (×4): 80 mg via ORAL
  Filled 2015-04-04 (×4): qty 1

## 2015-04-04 MED ORDER — SCOPOLAMINE 1 MG/3DAYS TD PT72
MEDICATED_PATCH | TRANSDERMAL | Status: AC
  Administered 2015-04-04: 1.5 mg via TRANSDERMAL
  Filled 2015-04-04: qty 1

## 2015-04-04 MED ORDER — KETOROLAC TROMETHAMINE 30 MG/ML IJ SOLN: 30.0000 mg | Freq: Four times a day (QID) | INTRAMUSCULAR | Status: DC | PRN

## 2015-04-04 MED ORDER — PHENYLEPHRINE 8 MG IN D5W 100 ML (0.08MG/ML) PREMIX OPTIME
INJECTION | INTRAVENOUS | Status: DC | PRN
  Administered 2015-04-04: 50 ug/min via INTRAVENOUS

## 2015-04-04 MED ORDER — TETANUS-DIPHTH-ACELL PERTUSSIS 5-2.5-18.5 LF-MCG/0.5 IM SUSP: 0.5000 mL | Freq: Once | INTRAMUSCULAR | Status: DC

## 2015-04-04 MED ORDER — SIMETHICONE 80 MG PO CHEW: 80.0000 mg | CHEWABLE_TABLET | ORAL | Status: DC | PRN

## 2015-04-04 MED ORDER — ONDANSETRON HCL 4 MG/2ML IJ SOLN
INTRAMUSCULAR | Status: DC | PRN
  Administered 2015-04-04: 4 mg via INTRAVENOUS

## 2015-04-04 MED ORDER — NALOXONE HCL 0.4 MG/ML IJ SOLN: 0.4000 mg | INTRAMUSCULAR | Status: DC | PRN

## 2015-04-04 MED ORDER — OXYTOCIN 10 UNIT/ML IJ SOLN
40.0000 [IU] | INTRAVENOUS | Status: DC | PRN
  Administered 2015-04-04: 40 [IU] via INTRAVENOUS

## 2015-04-04 MED ORDER — NALBUPHINE HCL 10 MG/ML IJ SOLN: 5.0000 mg | Freq: Once | INTRAMUSCULAR | Status: DC | PRN

## 2015-04-04 MED ORDER — OXYTOCIN 40 UNITS IN LACTATED RINGERS INFUSION - SIMPLE MED: 62.5000 mL/h | INTRAVENOUS | Status: AC

## 2015-04-04 MED ORDER — LEVOTHYROXINE SODIUM 75 MCG PO TABS
75.0000 ug | ORAL_TABLET | Freq: Every day | ORAL | Status: DC
  Filled 2015-04-04 (×3): qty 1

## 2015-04-04 MED ORDER — BUPIVACAINE IN DEXTROSE 0.75-8.25 % IT SOLN
INTRATHECAL | Status: DC | PRN
  Administered 2015-04-04: 12 mg via INTRATHECAL

## 2015-04-04 MED ORDER — CEFAZOLIN SODIUM-DEXTROSE 2-3 GM-% IV SOLR
INTRAVENOUS | Status: AC
  Filled 2015-04-04: qty 50

## 2015-04-04 MED ORDER — ONDANSETRON HCL 4 MG/2ML IJ SOLN: 4.0000 mg | Freq: Three times a day (TID) | INTRAMUSCULAR | Status: DC | PRN

## 2015-04-04 MED ORDER — LACTATED RINGERS IV SOLN
INTRAVENOUS | Status: DC
  Administered 2015-04-04 (×3): via INTRAVENOUS

## 2015-04-04 MED ORDER — DIPHENHYDRAMINE HCL 25 MG PO CAPS
25.0000 mg | ORAL_CAPSULE | ORAL | Status: DC | PRN
  Filled 2015-04-04: qty 1

## 2015-04-04 MED ORDER — MEPERIDINE HCL 25 MG/ML IJ SOLN: 6.2500 mg | INTRAMUSCULAR | Status: DC | PRN

## 2015-04-04 MED ORDER — LACTATED RINGERS IV SOLN
INTRAVENOUS | Status: DC
  Administered 2015-04-04: 18:00:00 via INTRAVENOUS

## 2015-04-04 MED ORDER — NALBUPHINE HCL 10 MG/ML IJ SOLN: 5.0000 mg | INTRAMUSCULAR | Status: DC | PRN

## 2015-04-04 MED ORDER — IBUPROFEN 600 MG PO TABS
600.0000 mg | ORAL_TABLET | Freq: Four times a day (QID) | ORAL | Status: DC
  Administered 2015-04-04 – 2015-04-06 (×6): 600 mg via ORAL
  Filled 2015-04-04 (×8): qty 1

## 2015-04-04 MED ORDER — OXYCODONE-ACETAMINOPHEN 5-325 MG PO TABS: 2.0000 | ORAL_TABLET | ORAL | Status: DC | PRN

## 2015-04-04 MED ORDER — NALOXONE HCL 1 MG/ML IJ SOLN: 1.0000 ug/kg/h | INTRAVENOUS | Status: DC | PRN

## 2015-04-04 MED ORDER — WITCH HAZEL-GLYCERIN EX PADS: 1.0000 "application " | MEDICATED_PAD | CUTANEOUS | Status: DC | PRN

## 2015-04-04 MED ORDER — OXYCODONE-ACETAMINOPHEN 5-325 MG PO TABS
1.0000 | ORAL_TABLET | ORAL | Status: DC | PRN
  Administered 2015-04-05 – 2015-04-06 (×2): 1 via ORAL
  Filled 2015-04-04 (×2): qty 1

## 2015-04-04 MED ORDER — DIBUCAINE 1 % RE OINT: 1.0000 "application " | TOPICAL_OINTMENT | RECTAL | Status: DC | PRN

## 2015-04-04 MED ORDER — DIPHENHYDRAMINE HCL 50 MG/ML IJ SOLN: 12.5000 mg | INTRAMUSCULAR | Status: DC | PRN

## 2015-04-04 MED ORDER — FENTANYL CITRATE (PF) 100 MCG/2ML IJ SOLN
INTRAMUSCULAR | Status: DC | PRN
  Administered 2015-04-04: 10 ug via INTRATHECAL

## 2015-04-04 MED ORDER — ONDANSETRON HCL 4 MG/2ML IJ SOLN
INTRAMUSCULAR | Status: AC
  Filled 2015-04-04: qty 2

## 2015-04-04 MED ORDER — LACTATED RINGERS IV SOLN
INTRAVENOUS | Status: DC | PRN
  Administered 2015-04-04: 09:00:00 via INTRAVENOUS

## 2015-04-04 MED ORDER — ZOLPIDEM TARTRATE 5 MG PO TABS: 5.0000 mg | ORAL_TABLET | Freq: Every evening | ORAL | Status: DC | PRN

## 2015-04-04 MED ORDER — PHENYLEPHRINE 8 MG IN D5W 100 ML (0.08MG/ML) PREMIX OPTIME
INJECTION | INTRAVENOUS | Status: AC
  Filled 2015-04-04: qty 100

## 2015-04-04 MED ORDER — METHYLERGONOVINE MALEATE 0.2 MG PO TABS: 0.2000 mg | ORAL_TABLET | ORAL | Status: DC | PRN

## 2015-04-04 MED ORDER — BUPIVACAINE IN DEXTROSE 0.75-8.25 % IT SOLN: INTRATHECAL | Status: DC | PRN

## 2015-04-04 MED ORDER — FERROUS SULFATE 325 (65 FE) MG PO TABS
325.0000 mg | ORAL_TABLET | Freq: Two times a day (BID) | ORAL | Status: DC
Start: 2015-04-04 — End: 2015-04-06
  Administered 2015-04-04 – 2015-04-06 (×4): 325 mg via ORAL
  Filled 2015-04-04 (×4): qty 1

## 2015-04-04 MED ORDER — OXYTOCIN 10 UNIT/ML IJ SOLN
INTRAMUSCULAR | Status: AC
  Filled 2015-04-04: qty 4

## 2015-04-04 MED ORDER — DIPHENHYDRAMINE HCL 25 MG PO CAPS: 25.0000 mg | ORAL_CAPSULE | Freq: Four times a day (QID) | ORAL | Status: DC | PRN

## 2015-04-04 MED ORDER — SENNOSIDES-DOCUSATE SODIUM 8.6-50 MG PO TABS
2.0000 | ORAL_TABLET | ORAL | Status: DC
  Administered 2015-04-04 – 2015-04-05 (×2): 2 via ORAL
  Filled 2015-04-04 (×2): qty 2

## 2015-04-04 MED ORDER — ACETAMINOPHEN 325 MG PO TABS: 650.0000 mg | ORAL_TABLET | ORAL | Status: DC | PRN

## 2015-04-04 MED ORDER — MORPHINE SULFATE (PF) 0.5 MG/ML IJ SOLN
INTRAMUSCULAR | Status: DC | PRN
  Administered 2015-04-04: .2 mg via INTRATHECAL

## 2015-04-04 MED ORDER — PRENATAL MULTIVITAMIN CH
1.0000 | ORAL_TABLET | Freq: Every day | ORAL | Status: DC
  Administered 2015-04-06: 1 via ORAL
  Filled 2015-04-04 (×2): qty 1

## 2015-04-04 MED ORDER — MORPHINE SULFATE 0.5 MG/ML IJ SOLN
INTRAMUSCULAR | Status: AC
  Filled 2015-04-04: qty 100

## 2015-04-04 MED ORDER — LANOLIN HYDROUS EX OINT: 1.0000 "application " | TOPICAL_OINTMENT | CUTANEOUS | Status: DC | PRN

## 2015-04-04 MED ORDER — FENTANYL CITRATE (PF) 100 MCG/2ML IJ SOLN
INTRAMUSCULAR | Status: AC
  Filled 2015-04-04: qty 4

## 2015-04-04 MED ORDER — SIMETHICONE 80 MG PO CHEW
80.0000 mg | CHEWABLE_TABLET | ORAL | Status: DC
  Administered 2015-04-04 – 2015-04-05 (×2): 80 mg via ORAL
  Filled 2015-04-04 (×2): qty 1

## 2015-04-04 MED ORDER — MENTHOL 3 MG MT LOZG: 1.0000 | LOZENGE | OROMUCOSAL | Status: DC | PRN

## 2015-04-04 MED ORDER — SCOPOLAMINE 1 MG/3DAYS TD PT72
1.0000 | MEDICATED_PATCH | Freq: Once | TRANSDERMAL | Status: DC
  Administered 2015-04-04: 1.5 mg via TRANSDERMAL

## 2015-04-04 MED ORDER — METHYLERGONOVINE MALEATE 0.2 MG/ML IJ SOLN: 0.2000 mg | INTRAMUSCULAR | Status: DC | PRN

## 2015-04-04 MED ORDER — SODIUM CHLORIDE 0.9 % IJ SOLN: 3.0000 mL | INTRAMUSCULAR | Status: DC | PRN

## 2015-04-04 SURGICAL SUPPLY — 38 items
BARRIER ADHS 3X4 INTERCEED (GAUZE/BANDAGES/DRESSINGS) ×4 IMPLANT
BENZOIN TINCTURE PRP APPL 2/3 (GAUZE/BANDAGES/DRESSINGS) ×4 IMPLANT
CLAMP CORD UMBIL (MISCELLANEOUS) IMPLANT
CLOSURE WOUND 1/2 X4 (GAUZE/BANDAGES/DRESSINGS) ×1
CLOTH BEACON ORANGE TIMEOUT ST (SAFETY) ×4 IMPLANT
DRAPE SHEET LG 3/4 BI-LAMINATE (DRAPES) ×4 IMPLANT
DRSG OPSITE POSTOP 4X10 (GAUZE/BANDAGES/DRESSINGS) ×4 IMPLANT
DURAPREP 26ML APPLICATOR (WOUND CARE) ×4 IMPLANT
ELECT REM PT RETURN 9FT ADLT (ELECTROSURGICAL) ×4
ELECTRODE REM PT RTRN 9FT ADLT (ELECTROSURGICAL) ×2 IMPLANT
EXTRACTOR VACUUM BELL STYLE (SUCTIONS) IMPLANT
GLOVE BIO SURGEON STRL SZ7 (GLOVE) ×4 IMPLANT
GLOVE BIOGEL PI IND STRL 7.0 (GLOVE) ×2 IMPLANT
GLOVE BIOGEL PI INDICATOR 7.0 (GLOVE) ×2
GOWN STRL REUS W/TWL LRG LVL3 (GOWN DISPOSABLE) ×8 IMPLANT
KIT ABG SYR 3ML LUER SLIP (SYRINGE) IMPLANT
LIQUID BAND (GAUZE/BANDAGES/DRESSINGS) IMPLANT
NEEDLE HYPO 25X5/8 SAFETYGLIDE (NEEDLE) IMPLANT
NS IRRIG 1000ML POUR BTL (IV SOLUTION) ×4 IMPLANT
PACK C SECTION WH (CUSTOM PROCEDURE TRAY) ×4 IMPLANT
PAD ABD 8X7 1/2 STERILE (GAUZE/BANDAGES/DRESSINGS) ×4 IMPLANT
PAD OB MATERNITY 4.3X12.25 (PERSONAL CARE ITEMS) ×4 IMPLANT
RTRCTR C-SECT PINK 25CM LRG (MISCELLANEOUS) ×4 IMPLANT
SPONGE GAUZE 4X4 12PLY STER LF (GAUZE/BANDAGES/DRESSINGS) ×4 IMPLANT
STRIP CLOSURE SKIN 1/2X4 (GAUZE/BANDAGES/DRESSINGS) ×3 IMPLANT
SUT CHROMIC 0 CTX 36 (SUTURE) ×12 IMPLANT
SUT PLAIN 2 0 (SUTURE)
SUT PLAIN 2 0 XLH (SUTURE) ×4 IMPLANT
SUT PLAIN ABS 2-0 54XMFL TIE (SUTURE) IMPLANT
SUT VIC AB 0 CT1 27 (SUTURE) ×4
SUT VIC AB 0 CT1 27XBRD ANBCTR (SUTURE) ×4 IMPLANT
SUT VIC AB 0 CTX 36 (SUTURE) ×6
SUT VIC AB 0 CTX36XBRD ANBCTRL (SUTURE) ×6 IMPLANT
SUT VIC AB 2-0 CT1 27 (SUTURE) ×2
SUT VIC AB 2-0 CT1 TAPERPNT 27 (SUTURE) ×2 IMPLANT
SUT VIC AB 4-0 KS 27 (SUTURE) ×4 IMPLANT
TOWEL OR 17X24 6PK STRL BLUE (TOWEL DISPOSABLE) ×4 IMPLANT
TRAY FOLEY CATH SILVER 14FR (SET/KITS/TRAYS/PACK) ×4 IMPLANT

## 2015-04-04 NOTE — Addendum Note (Signed)
Addendum  created 04/04/15 1316 by Asher Muir, CRNA   Modules edited: Notes Section   Notes Section:  File: [id number]

## 2015-04-04 NOTE — Op Note (Signed)
[name], [name]            ACCOUNT NO.:  [id number]  MEDICAL RECORD NO.:  [id number]  LOCATION:  9127                          FACILITY:  [facility]  PHYSICIAN:  Jola Schmidt, MD   DATE OF BIRTH:  April 24, 1981  DATE OF PROCEDURE:  04/04/2015 DATE OF DISCHARGE:                              OPERATIVE REPORT   PREOPERATIVE DIAGNOSES:  Intrauterine pregnancy at 39-0/7th weeks, history of previous cesarean section, declines trial of labor after cesarean.  POSTOPERATIVE DIAGNOSES:  Intrauterine pregnancy at 39-0/7th weeks, history of previous cesarean section, declines trial of labor after cesarean, keloid and macrosomic infant.  PROCEDURE:  Repeat low transverse cesarean section with two-layer closure, scar revision.  SURGEON:  Thurnell Lose, MD.  ASSISTANT:  Dr. Janyth Pupa and technician.  ANESTHESIA:  Spinal.  ESTIMATED BLOOD LOSS:  1100.  URINE:  200.  BLOOD ADMINISTERED:  None.  DRAINS:  Foley.  LOCAL:  None.  DISPOSITION:  The patient disposition was to PACU hemodynamically stable.  COMPLICATIONS:  None.  FINDINGS:  Viable female infant, vertex position, Apgars 8 and 9, weight is 9 pounds 1 ounce.  Cord around its left arm.  Normal uterus, ovaries, and fallopian tubes.  PROCEDURE IN DETAIL:  [name] was identified in the holding area. She was then taken to the operating room, where she underwent spinal anesthesia without complication.  A time-out was taken.  She was then prepped and draped in a normal sterile fashion.  Ancef 2 g IV was administered prior to the surgery starting.  Also prior to putting the drape on, keloid was noted at the incision and the abdomen.  The keloid was marked.  A scalpel was used to outline the previous scar.  Grasped with Allis clamps and then transected.  It was tossed, would not be sent to Pathology.  The subcutaneous space was then opened with the Bovie, down to the underlying layer of the fascia which was densely  scarred.  Fascia was opened with the Bovie at the midline and extended laterally with the curved Mayo scissors.  There were some dense adhesions of the fascia, it was difficult to identify rectus and fascia; however, that was done safely without any defect to the fascia with the scissors and the Kocher clamps.  Rectus muscles were easily separated at the midline. Peritoneum identified, tented with hemostats, and entered sharply with the Metzenbaum scissors.  After intraabdominal access was confirmed, the peritoneum was stretched.  Uterus was palpated for any adhesions. Alexis retractor was then placed.  Serosa was tented up with the [place] and Metzenbaum scissors that were used to make the bladder flap.  A mid transverse incision was made on the lower uterine segment and extended with the bandage scissors.  Clear fluid noted.  Occiput delivered easily to the incision.  Nose and mouth were suctioned.  Then, shoulders were delivered.  Baby had broad shoulders.  There was a nuchal cord noted on the left arm.  Baby was eventually delivered.  Delayed cord clamping was performed, after hemostating, the uterine edges.  Cord was clamped, cut. The baby was handed off to the awaiting NICU team.  Cord blood was collected.  The placenta was then removed and  uterus was cleared of all clots and debris with a moist laparotomy sponge, and the hysterotomy incision was grasped with ring forceps  to minimize bleeding.  Uterus was closed with 0 Vicryl in a continuous running fashion continuous locked fashion.  A second layer of the same suture was used for imbrication.  Two sutures were used for hemostasis.  Abdomen was irrigated copiously.  Interceed was then applied to the hysterotomy incision.  Peritoneum was closed with 2-0 Vicryl in continuous running fashion.  The fascia was then reapproximated with 0 Vicryl in a continuous running fashion and with two sutures.  Meeting on the right- hand side.  The  subcutaneous space was closed in 2 layers with 2-0 plain gut, and the incision was reapproximated with 4-0 Monocryl.  Steri- Strips and benzoin with a pressure dressing were to be applied.  All instrument, sponge, and needle counts were correct x3.  The patient tolerated the procedure well.  Baby was in the Esparto during the procedure.  Baby in the uterus was a little boggy, Pitocin was doubled and was adequate.     Jola Schmidt, MD     EBV/MEDQ  D:  04/04/2015  T:  04/04/2015  Job:  [id number]

## 2015-04-04 NOTE — Transfer of Care (Signed)
Immediate Anesthesia Transfer of Care Note  Patient: [name]  Procedure(s) Performed: Procedure(s): CESAREAN SECTION (N/A) SCAR REVISION of prior cesarean section scar  Patient Location: PACU  Anesthesia Type:Spinal  Level of Consciousness: awake  Airway & Oxygen Therapy: Patient Spontanous Breathing  Post-op Assessment: Report given to RN and Post -op Vital signs reviewed and stable  Post vital signs: stable  Last Vitals:  Filed Vitals:   04/04/15 0711  BP: 122/88  Pulse: 102  Temp: 36.6 C  Resp: 18    Complications: No apparent anesthesia complications

## 2015-04-04 NOTE — Anesthesia Postprocedure Evaluation (Signed)
Anesthesia Post Note  Patient: [name]  Procedure(s) Performed: Procedure(s) (LRB): CESAREAN SECTION (N/A) SCAR REVISION of prior cesarean section scar  Anesthesia type: Spinal  Patient location: Mother/Baby  Post pain: Pain level controlled  Post assessment: Post-op Vital signs reviewed  Last Vitals:  Filed Vitals:   04/04/15 1245  BP: 103/51  Pulse: 74  Temp: 36.7 C  Resp: 16    Post vital signs: Reviewed  Level of consciousness: awake  Complications: No apparent anesthesia complications

## 2015-04-04 NOTE — Anesthesia Postprocedure Evaluation (Signed)
  Anesthesia Post-op Note  Patient: [name]  Procedure(s) Performed: Procedure(s): CESAREAN SECTION (N/A) SCAR REVISION of prior cesarean section scar  Patient Location: PACU  Anesthesia Type:Spinal  Level of Consciousness: awake, alert  and oriented  Airway and Oxygen Therapy: Patient Spontanous Breathing  Post-op Pain: none  Post-op Assessment: Post-op Vital signs reviewed, Patient's Cardiovascular Status Stable, Respiratory Function Stable, Patent Airway, No signs of Nausea or vomiting, Pain level controlled, No headache, No backache, Spinal receding and Patient able to bend at knees LLE Motor Response: Purposeful movement LLE Sensation: Tingling RLE Motor Response: Purposeful movement RLE Sensation: Tingling L Sensory Level: S1-Sole of foot, small toes R Sensory Level: S1-Sole of foot, small toes  Post-op Vital Signs: Reviewed and stable  Last Vitals:  Filed Vitals:   04/04/15 1140  BP: 109/64  Pulse: 74  Temp: 36.7 C  Resp: 18    Complications: No apparent anesthesia complications

## 2015-04-04 NOTE — Brief Op Note (Signed)
04/04/2015  9:51 AM  PATIENT:  [name]  34 y.o. female  PRE-OPERATIVE DIAGNOSIS: IUP at 39 0/7 weeks, H/o previous cesarean section, declines TOLAC  POST-OPERATIVE DIAGNOSIS:  Same, Keloid, Macrosomic infant  PROCEDURE:  Procedure(s): CESAREAN SECTION (N/A) SCAR REVISION of prior cesarean section scar Repeat LTCS, 2 layer closure  SURGEON:  Surgeon(s) and Role:    * Thurnell Lose, MD - Primary    * Janyth Pupa, DO - Assisting  PHYSICIAN ASSISTANT: Dr. Nelda Marseille  ASSISTANTS: Technician   ANESTHESIA:   spinal  EBL:  Total I/O In: 3000 [I.V.:3000] Out: 1300 [Urine:200; Blood:1100]  BLOOD ADMINISTERED:none  DRAINS: Urinary Catheter (Foley)   LOCAL MEDICATIONS USED:  NONE  SPECIMEN:  No Specimen  DISPOSITION OF SPECIMEN:  N/A  COUNTS:  YES  TOURNIQUET:  * No tourniquets in log *  DICTATION: .Other Dictation: Dictation Number [id number]  PLAN OF CARE: Admit to inpatient   PATIENT DISPOSITION:  PACU - hemodynamically stable.   Delay start of Pharmacological VTE agent (>24hrs) due to surgical blood loss or risk of bleeding: yes

## 2015-04-04 NOTE — Interval H&P Note (Signed)
History and Physical Interval Note:  04/04/2015 8:13 AM  [name]  has presented today for surgery, with the diagnosis of Z98.89 H/O Cesarean Section  The various methods of treatment have been discussed with the patient and family. After consideration of risks, benefits and other options for treatment, the patient has consented to  Procedure(s): CESAREAN SECTION (N/A) as a surgical intervention .  The patient's history has been reviewed, patient examined, no change in status, stable for surgery.  I have reviewed the patient's chart and labs.  Questions were answered to the patient's satisfaction.    Platelets were 129 increased to 142.  Simona Huh, Cire Deyarmin

## 2015-04-04 NOTE — Lactation Note (Signed)
This note was copied from the chart of [facility]. Lactation Consultation Note Initial visit at 9 hours of age.  Mom reports a few feedings, but baby has been sleepy.  Baby asleep with visitors and mom getting up with [facility] RN.  Mom has 16 months experience with older child. [facility] LC resources given and discussed.  Encouraged to feed with early cues on demand and allow for STS frequently.  Early newborn behavior discussed.  Hand expression reported by mom with colostrum visible.  Mom to call for assist as needed.     Patient Name: [name] [id number] Date: 04/04/2015 Reason for consult: Initial assessment   Maternal Data Has patient been taught Hand Expression?: Yes Does the patient have breastfeeding experience prior to this delivery?: Yes  Feeding    LATCH Score/Interventions                Intervention(s): Breastfeeding basics reviewed     Lactation Tools Discussed/Used     Consult Status Consult Status: Follow-up Date: 04/05/15 Follow-up type: In-patient    Kendell Bane Justine Null 04/04/2015, 6:17 PM

## 2015-04-04 NOTE — Anesthesia Preprocedure Evaluation (Addendum)
Anesthesia Evaluation  Patient identified by MRN, date of birth, ID band Patient awake    Reviewed: Allergy & Precautions, NPO status , Patient's Chart, lab work & pertinent test results  Airway Mallampati: II  TM Distance: >3 FB Neck ROM: Full    Dental no notable dental hx. (+) Dental Advisory Given   Pulmonary neg pulmonary ROS,  breath sounds clear to auscultation  Pulmonary exam normal       Cardiovascular Exercise Tolerance: Good hypertension, negative cardio ROS Normal cardiovascular examRhythm:Regular Rate:Normal     Neuro/Psych negative neurological ROS  negative psych ROS   GI/Hepatic negative GI ROS, Neg liver ROS,   Endo/Other  negative endocrine ROSdiabetes, GestationalHypothyroidism   Renal/GU negative Renal ROS     Musculoskeletal negative musculoskeletal ROS (+)   Abdominal   Peds  Hematology negative hematology ROS (+)   Anesthesia Other Findings Day of surgery medications reviewed with the patient.  Reproductive/Obstetrics (+) Pregnancy                            Anesthesia Physical Anesthesia Plan  ASA: II  Anesthesia Plan: Spinal   Post-op Pain Management:    Induction:   Airway Management Planned:   Additional Equipment:   Intra-op Plan:   Post-operative Plan:   Informed Consent: I have reviewed the patients History and Physical, chart, labs and discussed the procedure including the risks, benefits and alternatives for the proposed anesthesia with the patient or authorized representative who has indicated his/her understanding and acceptance.   Dental advisory given  Plan Discussed with: CRNA, Anesthesiologist and Surgeon  Anesthesia Plan Comments: (Discussed risks and benefits of and differences between spinal and general. Discussed risks of spinal including headache, backache, failure, bleeding, infection, and nerve damage. Patient consents to spinal.  Questions answered. Coagulation studies and platelet count acceptable.)        Anesthesia Quick Evaluation

## 2015-04-04 NOTE — Anesthesia Procedure Notes (Signed)
Spinal Patient location during procedure: OR Start time: 04/04/2015 8:26 AM End time: 04/04/2015 8:29 AM Staffing Anesthesiologist: Catalina Gravel Performed by: anesthesiologist  Preanesthetic Checklist Completed: patient identified, surgical consent, pre-op evaluation, timeout performed, IV checked, risks and benefits discussed and monitors and equipment checked Spinal Block Patient position: sitting Prep: site prepped and draped and DuraPrep Patient monitoring: continuous pulse ox and blood pressure Approach: midline Location: L3-4 Needle Needle type: Pencan  Needle gauge: 24 G Assessment Sensory level: T4 Additional Notes Functioning IV was confirmed and monitors were applied. Sterile prep and drape, including hand hygiene, mask and sterile gloves were used. The patient was positioned and the spine was prepped. The skin was anesthetized with lidocaine.  Free flow of clear CSF was obtained prior to injecting local anesthetic into the CSF.  The spinal needle aspirated freely following injection.  The needle was carefully withdrawn.  The patient tolerated the procedure well. Consent was obtained prior to procedure with all questions answered and concerns addressed. Risks including but not limited to bleeding, infection, nerve damage, paralysis, failed block, inadequate analgesia, allergic reaction, high spinal, itching and headache were discussed and the patient wished to proceed.   Hoy Morn, MD

## 2015-04-05 ENCOUNTER — Encounter (HOSPITAL_COMMUNITY): Payer: Self-pay | Admitting: Obstetrics and Gynecology

## 2015-04-05 LAB — CBC
HCT: 29.8 % — ABNORMAL LOW (ref 36.0–46.0)
HEMOGLOBIN: 9.9 g/dL — AB (ref 12.0–15.0)
MCH: 29.7 pg (ref 26.0–34.0)
MCHC: 33.2 g/dL (ref 30.0–36.0)
MCV: 89.5 fL (ref 78.0–100.0)
Platelets: 131 10*3/uL — ABNORMAL LOW (ref 150–400)
RBC: 3.33 MIL/uL — AB (ref 3.87–5.11)
RDW: 13.8 % (ref 11.5–15.5)
WBC: 14.9 10*3/uL — ABNORMAL HIGH (ref 4.0–10.5)

## 2015-04-05 LAB — BIRTH TISSUE RECOVERY COLLECTION (PLACENTA DONATION)

## 2015-04-05 NOTE — Progress Notes (Signed)
Subjective: Postop Day 1: Cesarean Delivery No complaints.  Pain controlled.  Lochia normal.  Breast feeding yes.  Desires outpatient circumcision.  Objective: Temp:  [97.6 F (36.4 C)-98.3 F (36.8 C)] 98.3 F (36.8 C) (08/18 1927) Pulse Rate:  [52-87] 80 (08/18 1927) Resp:  [16-20] 18 (08/18 1927) BP: (96-127)/(54-76) 127/73 mmHg (08/18 1927) SpO2:  [97 %-98 %] 98 % (08/18 0900)  Physical Exam: Gen: NAD Lochia: Not visualized Uterine Fundus: firm, appropriately tender Incision: clean pressure dressing DVT Evaluation:  Edema present, no calf tenderness bilaterally    Recent Labs  04/05/15 0500  HGB 9.9*  HCT 29.8*    Assessment/Plan: Status post C-section-doing well postoperatively. Routine post op care. Encouraged ambulation.  Lactation support. Anticipate discharge tomorrow.  Dr. Landry Mellow covering 7 am, 04/06/15.    Thurnell Lose 04/05/2015, 10:10 PM

## 2015-04-05 NOTE — Discharge Instructions (Signed)

## 2015-04-06 MED ORDER — OXYCODONE-ACETAMINOPHEN 5-325 MG PO TABS: 1.0000 | ORAL_TABLET | ORAL | Status: DC | PRN

## 2015-04-06 MED ORDER — IBUPROFEN 600 MG PO TABS: 600.0000 mg | ORAL_TABLET | Freq: Four times a day (QID) | ORAL | Status: AC | PRN

## 2015-04-06 NOTE — Discharge Summary (Signed)
Obstetric Discharge Summary Reason for Admission: cesarean section Prenatal Procedures: none Intrapartum Procedures: cesarean: low cervical, transverse Postpartum Procedures: none Complications-Operative and Postpartum: none HEMOGLOBIN  Date Value Ref Range Status  04/05/2015 9.9* 12.0 - 15.0 g/dL Final   HCT  Date Value Ref Range Status  04/05/2015 29.8* 36.0 - 46.0 % Final    Physical Exam:  General: alert and cooperative Lochia: appropriate Uterine Fundus: firm Incision: healing well DVT Evaluation: No evidence of DVT seen on physical exam.  Discharge Diagnoses: Term Pregnancy-delivered  Discharge Information: Date: 04/06/2015 Activity: pelvic rest Diet: routine Medications: PNV, Ibuprofen and Percocet Condition: stable Instructions: refer to practice specific booklet Discharge to: home Follow-up Information    Follow up with Thurnell Lose, MD. Schedule an appointment as soon as possible for a visit in 2 weeks.   Specialty:  Obstetrics and Gynecology   Why:  Incision check   Contact information:   [address] [place] [address] [phone]       Follow up with Thurnell Lose, MD. Schedule an appointment as soon as possible for a visit in 2 weeks.   Specialty:  Obstetrics and Gynecology   Why:  incision check    Contact information:   [address]  [place] [address] [phone]       Newborn Data: Live born female  Birth Weight: 9 lb 0.6 oz (4100 g) APGAR: 9, 9  Home with mother.  Anureet Bruington J. 04/06/2015, 8:13 AM

## 2018-06-14 ENCOUNTER — Other Ambulatory Visit (HOSPITAL_COMMUNITY)
Admission: RE | Admit: 2018-06-14 | Discharge: 2018-06-14 | Disposition: A | Discharge status: Home / Self Care | Payer: Managed Care, Other (non HMO) | Source: Ambulatory Visit | Attending: Obstetrics and Gynecology | Admitting: Obstetrics and Gynecology

## 2018-06-14 ENCOUNTER — Other Ambulatory Visit: Payer: Self-pay | Admitting: Obstetrics and Gynecology

## 2018-06-14 DIAGNOSIS — Z01411 Encounter for gynecological examination (general) (routine) with abnormal findings: Secondary | ICD-10-CM | POA: Insufficient documentation

## 2018-06-17 LAB — CYTOLOGY - PAP
DIAGNOSIS: NEGATIVE
HPV (WINDOPATH): DETECTED — AB
HPV 16/18/45 genotyping: NEGATIVE

## 2018-08-25 DIAGNOSIS — N201 Calculus of ureter: Secondary | ICD-10-CM | POA: Diagnosis not present

## 2018-08-25 DIAGNOSIS — N202 Calculus of kidney with calculus of ureter: Secondary | ICD-10-CM | POA: Diagnosis not present

## 2018-08-25 DIAGNOSIS — R310 Gross hematuria: Secondary | ICD-10-CM | POA: Diagnosis not present

## 2018-08-25 DIAGNOSIS — N200 Calculus of kidney: Secondary | ICD-10-CM | POA: Diagnosis not present

## 2018-08-25 DIAGNOSIS — R8271 Bacteriuria: Secondary | ICD-10-CM | POA: Diagnosis not present

## 2018-09-02 DIAGNOSIS — N200 Calculus of kidney: Secondary | ICD-10-CM | POA: Diagnosis not present

## 2018-09-08 ENCOUNTER — Other Ambulatory Visit: Payer: Self-pay | Admitting: Urology

## 2018-09-27 ENCOUNTER — Other Ambulatory Visit: Payer: Self-pay | Admitting: Urology

## 2018-10-12 DIAGNOSIS — N200 Calculus of kidney: Secondary | ICD-10-CM | POA: Diagnosis not present

## 2018-10-21 ENCOUNTER — Encounter (HOSPITAL_COMMUNITY): Payer: Self-pay | Admitting: *Deleted

## 2018-10-25 ENCOUNTER — Encounter (HOSPITAL_COMMUNITY): Payer: Self-pay | Admitting: *Deleted

## 2018-10-25 ENCOUNTER — Other Ambulatory Visit: Payer: Self-pay

## 2018-10-25 ENCOUNTER — Ambulatory Visit (HOSPITAL_COMMUNITY): Payer: BLUE CROSS/BLUE SHIELD

## 2018-10-25 ENCOUNTER — Ambulatory Visit (HOSPITAL_COMMUNITY)
Admission: RE | Admit: 2018-10-25 | Discharge: 2018-10-25 | Disposition: A | Discharge status: Home / Self Care | Payer: BLUE CROSS/BLUE SHIELD | Attending: Urology | Admitting: Urology

## 2018-10-25 ENCOUNTER — Encounter (HOSPITAL_COMMUNITY)
Admission: RE | Disposition: A | Discharge status: Home / Self Care | Payer: Self-pay | Source: Home / Self Care | Attending: Urology

## 2018-10-25 DIAGNOSIS — N200 Calculus of kidney: Secondary | ICD-10-CM | POA: Insufficient documentation

## 2018-10-25 HISTORY — PX: EXTRACORPOREAL SHOCK WAVE LITHOTRIPSY: SHX1557

## 2018-10-25 HISTORY — DX: Personal history of urinary calculi: Z87.442

## 2018-10-25 LAB — PREGNANCY, URINE: Preg Test, Ur: NEGATIVE

## 2018-10-25 SURGERY — LITHOTRIPSY, ESWL
Anesthesia: LOCAL | Laterality: Left

## 2018-10-25 MED ORDER — ONDANSETRON 4 MG PO TBDP: 4.0000 mg | ORAL_TABLET | Freq: Three times a day (TID) | ORAL | 0 refills | Status: DC | PRN

## 2018-10-25 MED ORDER — TAMSULOSIN HCL 0.4 MG PO CAPS: 0.4000 mg | ORAL_CAPSULE | Freq: Every day | ORAL | 0 refills | Status: DC

## 2018-10-25 MED ORDER — OXYCODONE-ACETAMINOPHEN 5-325 MG PO TABS: 1.0000 | ORAL_TABLET | ORAL | 0 refills | Status: DC | PRN

## 2018-10-25 MED ORDER — DIAZEPAM 5 MG PO TABS
10.0000 mg | ORAL_TABLET | ORAL | Status: AC
  Administered 2018-10-25: 10 mg via ORAL
  Filled 2018-10-25: qty 2

## 2018-10-25 MED ORDER — DIPHENHYDRAMINE HCL 25 MG PO CAPS
25.0000 mg | ORAL_CAPSULE | ORAL | Status: AC
  Administered 2018-10-25: 25 mg via ORAL
  Filled 2018-10-25: qty 1

## 2018-10-25 MED ORDER — SODIUM CHLORIDE 0.9 % IV SOLN
INTRAVENOUS | Status: DC
  Administered 2018-10-25: 07:00:00 via INTRAVENOUS

## 2018-10-25 MED ORDER — CIPROFLOXACIN HCL 500 MG PO TABS
500.0000 mg | ORAL_TABLET | ORAL | Status: AC
  Administered 2018-10-25: 500 mg via ORAL
  Filled 2018-10-25: qty 1

## 2018-10-25 NOTE — Discharge Instructions (Signed)

## 2018-10-26 ENCOUNTER — Encounter (HOSPITAL_COMMUNITY): Payer: Self-pay | Admitting: Urology

## 2019-01-18 DIAGNOSIS — N200 Calculus of kidney: Secondary | ICD-10-CM | POA: Diagnosis not present

## 2019-01-21 DIAGNOSIS — R293 Abnormal posture: Secondary | ICD-10-CM | POA: Diagnosis not present

## 2019-01-21 DIAGNOSIS — M545 Low back pain: Secondary | ICD-10-CM | POA: Diagnosis not present

## 2019-01-21 DIAGNOSIS — M2560 Stiffness of unspecified joint, not elsewhere classified: Secondary | ICD-10-CM | POA: Diagnosis not present

## 2019-01-21 DIAGNOSIS — M9903 Segmental and somatic dysfunction of lumbar region: Secondary | ICD-10-CM | POA: Diagnosis not present

## 2019-01-25 DIAGNOSIS — M9903 Segmental and somatic dysfunction of lumbar region: Secondary | ICD-10-CM | POA: Diagnosis not present

## 2019-01-25 DIAGNOSIS — M2560 Stiffness of unspecified joint, not elsewhere classified: Secondary | ICD-10-CM | POA: Diagnosis not present

## 2019-01-25 DIAGNOSIS — R293 Abnormal posture: Secondary | ICD-10-CM | POA: Diagnosis not present

## 2019-01-25 DIAGNOSIS — M545 Low back pain: Secondary | ICD-10-CM | POA: Diagnosis not present

## 2019-01-31 DIAGNOSIS — M9903 Segmental and somatic dysfunction of lumbar region: Secondary | ICD-10-CM | POA: Diagnosis not present

## 2019-01-31 DIAGNOSIS — R293 Abnormal posture: Secondary | ICD-10-CM | POA: Diagnosis not present

## 2019-01-31 DIAGNOSIS — M545 Low back pain: Secondary | ICD-10-CM | POA: Diagnosis not present

## 2019-01-31 DIAGNOSIS — M2560 Stiffness of unspecified joint, not elsewhere classified: Secondary | ICD-10-CM | POA: Diagnosis not present

## 2019-02-02 DIAGNOSIS — M9903 Segmental and somatic dysfunction of lumbar region: Secondary | ICD-10-CM | POA: Diagnosis not present

## 2019-02-02 DIAGNOSIS — R293 Abnormal posture: Secondary | ICD-10-CM | POA: Diagnosis not present

## 2019-02-02 DIAGNOSIS — M545 Low back pain: Secondary | ICD-10-CM | POA: Diagnosis not present

## 2019-02-02 DIAGNOSIS — M2560 Stiffness of unspecified joint, not elsewhere classified: Secondary | ICD-10-CM | POA: Diagnosis not present

## 2019-02-07 DIAGNOSIS — R293 Abnormal posture: Secondary | ICD-10-CM | POA: Diagnosis not present

## 2019-02-07 DIAGNOSIS — M9903 Segmental and somatic dysfunction of lumbar region: Secondary | ICD-10-CM | POA: Diagnosis not present

## 2019-02-07 DIAGNOSIS — M2560 Stiffness of unspecified joint, not elsewhere classified: Secondary | ICD-10-CM | POA: Diagnosis not present

## 2019-02-07 DIAGNOSIS — M545 Low back pain: Secondary | ICD-10-CM | POA: Diagnosis not present

## 2019-02-09 DIAGNOSIS — R293 Abnormal posture: Secondary | ICD-10-CM | POA: Diagnosis not present

## 2019-02-09 DIAGNOSIS — M2560 Stiffness of unspecified joint, not elsewhere classified: Secondary | ICD-10-CM | POA: Diagnosis not present

## 2019-02-09 DIAGNOSIS — M9903 Segmental and somatic dysfunction of lumbar region: Secondary | ICD-10-CM | POA: Diagnosis not present

## 2019-02-09 DIAGNOSIS — M545 Low back pain: Secondary | ICD-10-CM | POA: Diagnosis not present

## 2019-02-10 DIAGNOSIS — M545 Low back pain: Secondary | ICD-10-CM | POA: Diagnosis not present

## 2019-02-10 DIAGNOSIS — R293 Abnormal posture: Secondary | ICD-10-CM | POA: Diagnosis not present

## 2019-02-10 DIAGNOSIS — M9903 Segmental and somatic dysfunction of lumbar region: Secondary | ICD-10-CM | POA: Diagnosis not present

## 2019-02-10 DIAGNOSIS — M2560 Stiffness of unspecified joint, not elsewhere classified: Secondary | ICD-10-CM | POA: Diagnosis not present

## 2019-02-14 DIAGNOSIS — M545 Low back pain: Secondary | ICD-10-CM | POA: Diagnosis not present

## 2019-02-14 DIAGNOSIS — M2560 Stiffness of unspecified joint, not elsewhere classified: Secondary | ICD-10-CM | POA: Diagnosis not present

## 2019-02-14 DIAGNOSIS — M9903 Segmental and somatic dysfunction of lumbar region: Secondary | ICD-10-CM | POA: Diagnosis not present

## 2019-02-14 DIAGNOSIS — R293 Abnormal posture: Secondary | ICD-10-CM | POA: Diagnosis not present

## 2019-02-15 DIAGNOSIS — M9903 Segmental and somatic dysfunction of lumbar region: Secondary | ICD-10-CM | POA: Diagnosis not present

## 2019-02-15 DIAGNOSIS — M545 Low back pain: Secondary | ICD-10-CM | POA: Diagnosis not present

## 2019-02-15 DIAGNOSIS — M2560 Stiffness of unspecified joint, not elsewhere classified: Secondary | ICD-10-CM | POA: Diagnosis not present

## 2019-02-15 DIAGNOSIS — R293 Abnormal posture: Secondary | ICD-10-CM | POA: Diagnosis not present

## 2019-02-21 DIAGNOSIS — M545 Low back pain: Secondary | ICD-10-CM | POA: Diagnosis not present

## 2019-02-21 DIAGNOSIS — M9903 Segmental and somatic dysfunction of lumbar region: Secondary | ICD-10-CM | POA: Diagnosis not present

## 2019-02-21 DIAGNOSIS — M2560 Stiffness of unspecified joint, not elsewhere classified: Secondary | ICD-10-CM | POA: Diagnosis not present

## 2019-02-21 DIAGNOSIS — R293 Abnormal posture: Secondary | ICD-10-CM | POA: Diagnosis not present

## 2019-02-23 DIAGNOSIS — R293 Abnormal posture: Secondary | ICD-10-CM | POA: Diagnosis not present

## 2019-02-23 DIAGNOSIS — M545 Low back pain: Secondary | ICD-10-CM | POA: Diagnosis not present

## 2019-02-23 DIAGNOSIS — M2560 Stiffness of unspecified joint, not elsewhere classified: Secondary | ICD-10-CM | POA: Diagnosis not present

## 2019-02-23 DIAGNOSIS — M9903 Segmental and somatic dysfunction of lumbar region: Secondary | ICD-10-CM | POA: Diagnosis not present

## 2019-02-24 DIAGNOSIS — R293 Abnormal posture: Secondary | ICD-10-CM | POA: Diagnosis not present

## 2019-02-24 DIAGNOSIS — M2560 Stiffness of unspecified joint, not elsewhere classified: Secondary | ICD-10-CM | POA: Diagnosis not present

## 2019-02-24 DIAGNOSIS — M9903 Segmental and somatic dysfunction of lumbar region: Secondary | ICD-10-CM | POA: Diagnosis not present

## 2019-02-24 DIAGNOSIS — M545 Low back pain: Secondary | ICD-10-CM | POA: Diagnosis not present

## 2019-02-26 DIAGNOSIS — M545 Low back pain: Secondary | ICD-10-CM | POA: Diagnosis not present

## 2019-02-26 DIAGNOSIS — M2560 Stiffness of unspecified joint, not elsewhere classified: Secondary | ICD-10-CM | POA: Diagnosis not present

## 2019-02-26 DIAGNOSIS — M9903 Segmental and somatic dysfunction of lumbar region: Secondary | ICD-10-CM | POA: Diagnosis not present

## 2019-02-26 DIAGNOSIS — R293 Abnormal posture: Secondary | ICD-10-CM | POA: Diagnosis not present

## 2019-11-21 DIAGNOSIS — Z20828 Contact with and (suspected) exposure to other viral communicable diseases: Secondary | ICD-10-CM | POA: Diagnosis not present

## 2019-12-21 DIAGNOSIS — Z01411 Encounter for gynecological examination (general) (routine) with abnormal findings: Secondary | ICD-10-CM | POA: Diagnosis not present

## 2019-12-21 DIAGNOSIS — R87810 Cervical high risk human papillomavirus (HPV) DNA test positive: Secondary | ICD-10-CM | POA: Diagnosis not present

## 2019-12-21 DIAGNOSIS — N852 Hypertrophy of uterus: Secondary | ICD-10-CM | POA: Diagnosis not present

## 2019-12-21 DIAGNOSIS — N3281 Overactive bladder: Secondary | ICD-10-CM | POA: Diagnosis not present

## 2019-12-23 ENCOUNTER — Other Ambulatory Visit: Payer: Self-pay | Admitting: Obstetrics and Gynecology

## 2019-12-23 DIAGNOSIS — N852 Hypertrophy of uterus: Secondary | ICD-10-CM

## 2020-01-03 ENCOUNTER — Other Ambulatory Visit: Payer: Managed Care, Other (non HMO)

## 2020-01-11 ENCOUNTER — Ambulatory Visit
Admission: RE | Admit: 2020-01-11 | Discharge: 2020-01-11 | Disposition: A | Discharge status: Home / Self Care | Payer: BC Managed Care – PPO | Source: Ambulatory Visit | Attending: Obstetrics and Gynecology | Admitting: Obstetrics and Gynecology

## 2020-01-11 DIAGNOSIS — N852 Hypertrophy of uterus: Secondary | ICD-10-CM

## 2020-01-11 DIAGNOSIS — N83201 Unspecified ovarian cyst, right side: Secondary | ICD-10-CM | POA: Diagnosis not present

## 2020-03-13 DIAGNOSIS — N83201 Unspecified ovarian cyst, right side: Secondary | ICD-10-CM | POA: Diagnosis not present

## 2020-03-28 DIAGNOSIS — Z308 Encounter for other contraceptive management: Secondary | ICD-10-CM | POA: Diagnosis not present

## 2020-03-28 DIAGNOSIS — N83201 Unspecified ovarian cyst, right side: Secondary | ICD-10-CM | POA: Diagnosis not present

## 2020-04-05 ENCOUNTER — Other Ambulatory Visit: Payer: Self-pay

## 2020-04-05 ENCOUNTER — Encounter (HOSPITAL_BASED_OUTPATIENT_CLINIC_OR_DEPARTMENT_OTHER): Payer: Self-pay | Admitting: Obstetrics & Gynecology

## 2020-04-07 ENCOUNTER — Other Ambulatory Visit (HOSPITAL_COMMUNITY): Payer: BC Managed Care – PPO

## 2020-04-09 ENCOUNTER — Encounter (HOSPITAL_BASED_OUTPATIENT_CLINIC_OR_DEPARTMENT_OTHER)
Admission: RE | Admit: 2020-04-09 | Discharge: 2020-04-09 | Disposition: A | Discharge status: Home / Self Care | Payer: BC Managed Care – PPO | Source: Ambulatory Visit | Attending: Obstetrics & Gynecology | Admitting: Obstetrics & Gynecology

## 2020-04-09 ENCOUNTER — Other Ambulatory Visit: Payer: Self-pay

## 2020-04-09 ENCOUNTER — Other Ambulatory Visit (HOSPITAL_COMMUNITY)
Admission: RE | Admit: 2020-04-09 | Discharge: 2020-04-09 | Disposition: A | Discharge status: Home / Self Care | Payer: BC Managed Care – PPO | Source: Ambulatory Visit | Attending: Obstetrics & Gynecology | Admitting: Obstetrics & Gynecology

## 2020-04-09 DIAGNOSIS — Z01812 Encounter for preprocedural laboratory examination: Secondary | ICD-10-CM | POA: Insufficient documentation

## 2020-04-09 DIAGNOSIS — Z20822 Contact with and (suspected) exposure to covid-19: Secondary | ICD-10-CM | POA: Insufficient documentation

## 2020-04-09 LAB — COMPREHENSIVE METABOLIC PANEL
ALT: 13 U/L (ref 0–44)
AST: 18 U/L (ref 15–41)
Albumin: 4.4 g/dL (ref 3.5–5.0)
Alkaline Phosphatase: 47 U/L (ref 38–126)
Anion gap: 10 (ref 5–15)
BUN: 10 mg/dL (ref 6–20)
CO2: 25 mmol/L (ref 22–32)
Calcium: 9.4 mg/dL (ref 8.9–10.3)
Chloride: 102 mmol/L (ref 98–111)
Creatinine, Ser: 0.64 mg/dL (ref 0.44–1.00)
GFR calc Af Amer: >60 mL/min (ref >60)
GFR calc non Af Amer: >60 mL/min (ref >60)
Glucose, Bld: 90 mg/dL (ref 70–99)
Potassium: 3.9 mmol/L (ref 3.5–5.1)
Sodium: 137 mmol/L (ref 135–145)
Total Bilirubin: 0.8 mg/dL (ref 0.3–1.2)
Total Protein: 7.3 g/dL (ref 6.5–8.1)

## 2020-04-09 LAB — CBC
HCT: 40.4 % (ref 36.0–46.0)
Hemoglobin: 13.8 g/dL (ref 12.0–15.0)
MCH: 31.7 pg (ref 26.0–34.0)
MCHC: 34.2 g/dL (ref 30.0–36.0)
MCV: 92.9 fL (ref 80.0–100.0)
Platelets: 205 10*3/uL (ref 150–400)
RBC: 4.35 MIL/uL (ref 3.87–5.11)
RDW: 11.8 % (ref 11.5–15.5)
WBC: 9.5 10*3/uL (ref 4.0–10.5)
nRBC: 0 % (ref 0.0–0.2)

## 2020-04-09 LAB — SARS CORONAVIRUS 2 (TAT 6-24 HRS): SARS Coronavirus 2: NEGATIVE

## 2020-04-09 NOTE — Progress Notes (Signed)

## 2020-04-10 NOTE — H&P (Signed)
39yo [id number] who presents for laparoscopic bilateral salpingectomy and right oophorectomy due to dermoid cyst.  Ovarian cyst: Initially at her annual in May- pelvic fullness was noted- [place] completed 12/2019- showed ?hemorrhagic cyst that was 1.5cm in size. PLan was for repeat US- completed US 03/13/20: Retroverted normal uterus. Right ovary- complex cyst- hyperechoic mass 3x2.9cm- ?dermoid. Left ovary with 2 cyst complex cyst with low levels internal echoes- 2.1 and 2.6cm- avascular She presents to discuss next step regarding ovarian cyst since it has increased in size. Initially, she was not having any symptoms; however, on occasion, she may note RLQ pain- sharp- last for a few seconds then resolve. Usually occurs maybe once every 1-2 weeks. Denies bloating, regular pelvic or abdominal pain. Menses regular- no other acute complaints Last pap/HPV 12/2019 neg.      ROS:  CONSTITUTIONAL:  no Chills. no Fever. no Night sweats.  HEENT:  Blurrred vision no. no Double vision.  CARDIOLOGY:  no Chest pain.  RESPIRATORY:  no Shortness of breath. no Cough.  UROLOGY:  no Urinary frequency. no Urinary incontinence. no Urinary urgency.  GASTROENTEROLOGY:  no Abdominal pain. no Appetite change. no Change in bowel movements.  FEMALE REPRODUCTIVE:  no Breast lumps or discharge. no Breast pain.  NEUROLOGY:  no Dizziness. no Headache. no Loss of consciousness.  PSYCHOLOGY:  no Anxiety. no Depression.  SKIN:  no Rash. no Hives.  HEMATOLOGY/LYMPH:  no Anemia. no Fatigue. Using Blood Thinners no.         Medical History: Reflux, Kidney stones- Dr. Gloriann Loan, lithotripsy, 08/2018.        Gyn History:  Sexual activity currently sexually active, 1 lifetime partner.  Periods : regular.  LMP 03/19/2020.  Birth control Natural family planning method.  Last pap smear date 12/21/2019-Neg/HPV neg.  Last mammogram date n/a.  Denies H/O Abnormal pap smear.  Denies H/O STD.  Menarche 59.  Trying to get pregnant  no.        OB History:  Number of pregnancies 2.  Pregnancy # 1 live birth, C-section delivery, boy "Lenard Simmer".  Pregnancy # 2 miscarriage.  Pregnancy # 3 live birth, C-section, boy.        Surgical History: wisdom teeth , c-section x 2 , D&C 11/2013, lithotripsy 08/2018, shockwave therapy 10/2018.        Hospitalization/Major Diagnostic Procedure: childbirth x 2 , None this past yr. 05/2018.        Family History: Father: alive 6 yrs, Heart issues. Mother: alive 36 yrs, DVT in arm, endometrial cancer-remission, double Hernia, Gall bladder surgery. Paternal [place] Father: HTN, liver CA, diagnosed with Hypertension. Paternal Grand Mother: HTN. Paternal aunt: breast cancer (2 aunts breast cancer). 2 son(s) . .  no biological brothers\\\\\\\/sisters.       Social History:  General:  Tobacco use  cigarettes: Never smoked Tobacco history last updated 03/28/2020 no EXPOSURE TO PASSIVE SMOKE.  no Alcohol.  Caffeine: yes, Coffee 3-4 times a week, soda once or twice a week.  no Recreational drug use.  Exercise: intermittent, walking, zumba.  Marital Status: Married 10/09 Remo Lipps.  Children: Boys, 2.  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  no Domestic Violence.        Medications: Taking Emergen-C Immune - Packet Orally , Taking Multivitamin - Tablet 1 tablet Orally Once a day, Taking Zinc 25 MG Tablet 1 tablet Orally Once a day, Notes: 20 mg, Taking Elderberry 575 MG/5ML Syrup Orally , Not-Taking Cipro(Ciprofloxacin) 250 MG Tablet 1 tablet Orally Twice  a day, Medication List reviewed and reconciled with the patient       Allergies: N.K.D.A.      Objective:     Vitals: Wt 145.9, Wt change 2.2 lb, Ht 63, BMI 25.84, Pulse sitting 100, BP sitting 114/80.       Examination:  General Examination: CONSTITUTIONAL: well developed, well nourished.  SKIN: warm and dry, no rashes.  NECK: supple, normal appearance.  LUNGS: clear to auscultation bilaterally, no  wheezes, rhonchi, rales.  HEART: no murmurs, regular rate and rhythm.  ABDOMEN: soft and not tender, no masses palpated, no rebound, no rigidity.  MUSCULOSKELETAL no calf tenderness bilaterally.  EXTREMITIES: no edema present.  PSYCH: appropriate mood and affect.    A/P: 39yo female who presents for laparoscopic bilateral salpingectomy and right oophorectomy due to dermoid cyst and desire for permanent contraception  -NPO -LR @ 125cc/hr -SCDs to OR -Risk/benefits and alternatives reviewed with patient including risk of bleeding, infection and injury to surrounding structures.  Questions and concerns were addressed and she desires to proceed.  Janyth Pupa, DO [phone] (cell) [phone] (office)

## 2020-04-10 NOTE — Progress Notes (Signed)
Patient called stating that she and husband were exposed to covid on Saturday 04/07/2020. We made aware today of this. Neither have symptoms, patient tested negative 04/09/2020. However husband has not been tested and his primary caregiver for patient. She will postpone surgery and call Dr. Tyson Dense office to r/s. I advised she would need to re test for covid.  Would not need additional lab work if able to reschedule within 10-14 days of 04/09/2020. She verbalized understanding.

## 2020-05-02 ENCOUNTER — Encounter (HOSPITAL_BASED_OUTPATIENT_CLINIC_OR_DEPARTMENT_OTHER): Payer: Self-pay | Admitting: Obstetrics & Gynecology

## 2020-05-02 ENCOUNTER — Other Ambulatory Visit: Payer: Self-pay

## 2020-05-02 DIAGNOSIS — Z308 Encounter for other contraceptive management: Secondary | ICD-10-CM | POA: Diagnosis not present

## 2020-05-02 DIAGNOSIS — N83201 Unspecified ovarian cyst, right side: Secondary | ICD-10-CM | POA: Diagnosis not present

## 2020-05-02 DIAGNOSIS — Z01818 Encounter for other preprocedural examination: Secondary | ICD-10-CM | POA: Diagnosis not present

## 2020-05-05 ENCOUNTER — Inpatient Hospital Stay (HOSPITAL_COMMUNITY): Admission: RE | Admit: 2020-05-05 | Payer: BC Managed Care – PPO | Source: Ambulatory Visit

## 2020-05-07 ENCOUNTER — Other Ambulatory Visit (HOSPITAL_COMMUNITY)
Admission: RE | Admit: 2020-05-07 | Discharge: 2020-05-07 | Disposition: A | Discharge status: Home / Self Care | Payer: BC Managed Care – PPO | Source: Ambulatory Visit | Attending: Obstetrics & Gynecology | Admitting: Obstetrics & Gynecology

## 2020-05-07 DIAGNOSIS — Z01812 Encounter for preprocedural laboratory examination: Secondary | ICD-10-CM | POA: Diagnosis not present

## 2020-05-07 DIAGNOSIS — Z20822 Contact with and (suspected) exposure to covid-19: Secondary | ICD-10-CM | POA: Insufficient documentation

## 2020-05-08 LAB — SARS CORONAVIRUS 2 (TAT 6-24 HRS): SARS Coronavirus 2: NEGATIVE

## 2020-05-09 ENCOUNTER — Ambulatory Visit (HOSPITAL_BASED_OUTPATIENT_CLINIC_OR_DEPARTMENT_OTHER)
Admission: RE | Admit: 2020-05-09 | Discharge: 2020-05-09 | Disposition: A | Discharge status: Home / Self Care | Payer: BC Managed Care – PPO | Attending: Obstetrics & Gynecology | Admitting: Obstetrics & Gynecology

## 2020-05-09 ENCOUNTER — Encounter (HOSPITAL_BASED_OUTPATIENT_CLINIC_OR_DEPARTMENT_OTHER)
Admission: RE | Disposition: A | Discharge status: Home / Self Care | Payer: Self-pay | Source: Home / Self Care | Attending: Obstetrics & Gynecology

## 2020-05-09 ENCOUNTER — Ambulatory Visit (HOSPITAL_BASED_OUTPATIENT_CLINIC_OR_DEPARTMENT_OTHER): Payer: BC Managed Care – PPO | Admitting: Certified Registered Nurse Anesthetist

## 2020-05-09 ENCOUNTER — Encounter (HOSPITAL_BASED_OUTPATIENT_CLINIC_OR_DEPARTMENT_OTHER): Payer: Self-pay | Admitting: Obstetrics & Gynecology

## 2020-05-09 ENCOUNTER — Other Ambulatory Visit: Payer: Self-pay

## 2020-05-09 DIAGNOSIS — D279 Benign neoplasm of unspecified ovary: Secondary | ICD-10-CM | POA: Diagnosis not present

## 2020-05-09 DIAGNOSIS — D270 Benign neoplasm of right ovary: Secondary | ICD-10-CM | POA: Diagnosis not present

## 2020-05-09 DIAGNOSIS — Z302 Encounter for sterilization: Secondary | ICD-10-CM | POA: Diagnosis not present

## 2020-05-09 HISTORY — PX: LAPAROSCOPIC BILATERAL SALPINGECTOMY: SHX5889

## 2020-05-09 LAB — POCT PREGNANCY, URINE: Preg Test, Ur: NEGATIVE

## 2020-05-09 SURGERY — OOPHORECTOMY, LAPAROSCOPIC
Anesthesia: General | Site: Abdomen | Laterality: Right

## 2020-05-09 MED ORDER — BUPIVACAINE HCL (PF) 0.25 % IJ SOLN
INTRAMUSCULAR | Status: DC | PRN
  Administered 2020-05-09: 20 mL

## 2020-05-09 MED ORDER — ONDANSETRON HCL 4 MG/2ML IJ SOLN
INTRAMUSCULAR | Status: DC | PRN
  Administered 2020-05-09: 4 mg via INTRAVENOUS

## 2020-05-09 MED ORDER — OXYCODONE HCL 5 MG PO TABS
5.0000 mg | ORAL_TABLET | Freq: Four times a day (QID) | ORAL | 0 refills | Status: AC | PRN
Start: 2020-05-09 — End: 2020-05-14

## 2020-05-09 MED ORDER — SCOPOLAMINE 1 MG/3DAYS TD PT72
1.0000 | MEDICATED_PATCH | TRANSDERMAL | Status: DC
  Administered 2020-05-09: 1.5 mg via TRANSDERMAL

## 2020-05-09 MED ORDER — LACTATED RINGERS IV SOLN: INTRAVENOUS | Status: DC

## 2020-05-09 MED ORDER — PHENYLEPHRINE HCL (PRESSORS) 10 MG/ML IV SOLN
INTRAVENOUS | Status: DC | PRN
  Administered 2020-05-09: 120 ug via INTRAVENOUS
  Administered 2020-05-09: 80 ug via INTRAVENOUS

## 2020-05-09 MED ORDER — MIDAZOLAM HCL 2 MG/2ML IJ SOLN: 0.5000 mg | Freq: Once | INTRAMUSCULAR | Status: DC | PRN

## 2020-05-09 MED ORDER — SODIUM CHLORIDE 0.9 % IR SOLN
Status: DC | PRN
  Administered 2020-05-09: 200 mL

## 2020-05-09 MED ORDER — OXYCODONE HCL 5 MG/5ML PO SOLN: 5.0000 mg | Freq: Once | ORAL | Status: AC | PRN

## 2020-05-09 MED ORDER — LIDOCAINE HCL (CARDIAC) PF 100 MG/5ML IV SOSY
PREFILLED_SYRINGE | INTRAVENOUS | Status: DC | PRN
  Administered 2020-05-09: 40 mg via INTRATRACHEAL

## 2020-05-09 MED ORDER — ACETAMINOPHEN 500 MG PO TABS
1000.0000 mg | ORAL_TABLET | Freq: Once | ORAL | Status: AC
  Administered 2020-05-09: 1000 mg via ORAL

## 2020-05-09 MED ORDER — OXYCODONE HCL 5 MG PO TABS
5.0000 mg | ORAL_TABLET | Freq: Once | ORAL | Status: AC | PRN
  Administered 2020-05-09: 5 mg via ORAL

## 2020-05-09 MED ORDER — DEXAMETHASONE SODIUM PHOSPHATE 10 MG/ML IJ SOLN
INTRAMUSCULAR | Status: DC | PRN
  Administered 2020-05-09: 10 mg via INTRAVENOUS

## 2020-05-09 MED ORDER — PROMETHAZINE HCL 25 MG/ML IJ SOLN: 6.2500 mg | INTRAMUSCULAR | Status: DC | PRN

## 2020-05-09 MED ORDER — HYDROMORPHONE HCL 1 MG/ML IJ SOLN: 0.2500 mg | INTRAMUSCULAR | Status: DC | PRN

## 2020-05-09 MED ORDER — DOCUSATE SODIUM 100 MG PO CAPS: 100.0000 mg | ORAL_CAPSULE | Freq: Two times a day (BID) | ORAL | 0 refills | Status: AC | PRN

## 2020-05-09 MED ORDER — GLYCOPYRROLATE 0.2 MG/ML IJ SOLN
INTRAMUSCULAR | Status: DC | PRN
  Administered 2020-05-09: .1 mg via INTRAVENOUS

## 2020-05-09 MED ORDER — MIDAZOLAM HCL 2 MG/2ML IJ SOLN
INTRAMUSCULAR | Status: DC | PRN
  Administered 2020-05-09: 2 mg via INTRAVENOUS

## 2020-05-09 MED ORDER — ACETAMINOPHEN 500 MG PO TABS
ORAL_TABLET | ORAL | Status: AC
  Filled 2020-05-09: qty 2

## 2020-05-09 MED ORDER — OXYCODONE HCL 5 MG PO TABS
ORAL_TABLET | ORAL | Status: AC
Start: 2020-05-09 — End: ?
  Filled 2020-05-09: qty 1

## 2020-05-09 MED ORDER — MEPERIDINE HCL 25 MG/ML IJ SOLN: 6.2500 mg | INTRAMUSCULAR | Status: DC | PRN

## 2020-05-09 MED ORDER — ROCURONIUM BROMIDE 100 MG/10ML IV SOLN
INTRAVENOUS | Status: DC | PRN
  Administered 2020-05-09: 60 mg via INTRAVENOUS

## 2020-05-09 MED ORDER — POVIDONE-IODINE 10 % EX SWAB
2.0000 "application " | Freq: Once | CUTANEOUS | Status: AC
  Administered 2020-05-09: 2 via TOPICAL

## 2020-05-09 MED ORDER — PROPOFOL 10 MG/ML IV BOLUS
INTRAVENOUS | Status: DC | PRN
  Administered 2020-05-09: 120 mg via INTRAVENOUS

## 2020-05-09 MED ORDER — FENTANYL CITRATE (PF) 100 MCG/2ML IJ SOLN
INTRAMUSCULAR | Status: DC | PRN
  Administered 2020-05-09: 100 ug via INTRAVENOUS

## 2020-05-09 MED ORDER — SUGAMMADEX SODIUM 200 MG/2ML IV SOLN
INTRAVENOUS | Status: DC | PRN
  Administered 2020-05-09: 200 mg via INTRAVENOUS

## 2020-05-09 MED ORDER — ARTIFICIAL TEARS OPHTHALMIC OINT
TOPICAL_OINTMENT | OPHTHALMIC | Status: DC | PRN
  Administered 2020-05-09: 1 via OPHTHALMIC

## 2020-05-09 MED ORDER — SCOPOLAMINE 1 MG/3DAYS TD PT72
MEDICATED_PATCH | TRANSDERMAL | Status: AC
  Filled 2020-05-09: qty 1

## 2020-05-09 SURGICAL SUPPLY — 39 items
ADH SKN CLS APL DERMABOND .7 (GAUZE/BANDAGES/DRESSINGS) ×2
APL SRG 38 LTWT LNG FL B (MISCELLANEOUS)
APPLICATOR ARISTA FLEXITIP XL (MISCELLANEOUS) IMPLANT
BAG SPEC RTRVL LRG 6X4 10 (ENDOMECHANICALS) ×2
DERMABOND ADVANCED (GAUZE/BANDAGES/DRESSINGS) ×2
DERMABOND ADVANCED .7 DNX12 (GAUZE/BANDAGES/DRESSINGS) ×2 IMPLANT
DILATOR CANAL MILEX (MISCELLANEOUS) IMPLANT
DRSG OPSITE POSTOP 3X4 (GAUZE/BANDAGES/DRESSINGS) ×4 IMPLANT
DURAPREP 26ML APPLICATOR (WOUND CARE) ×4 IMPLANT
FORCEPS CUTTING 33CM 5MM (CUTTING FORCEPS) IMPLANT
GAUZE 4X4 16PLY RFD (DISPOSABLE) ×4 IMPLANT
GLOVE BIOGEL PI IND STRL 6.5 (GLOVE) ×4 IMPLANT
GLOVE BIOGEL PI IND STRL 7.0 (GLOVE) ×4 IMPLANT
GLOVE BIOGEL PI INDICATOR 6.5 (GLOVE) ×4
GLOVE BIOGEL PI INDICATOR 7.0 (GLOVE) ×4
GLOVE ECLIPSE 6.5 STRL STRAW (GLOVE) ×4 IMPLANT
GOWN STRL REUS W/TWL LRG LVL3 (GOWN DISPOSABLE) ×12 IMPLANT
HEMOSTAT ARISTA ABSORB 3G PWDR (HEMOSTASIS) IMPLANT
NEEDLE INSUFFLATION 120MM (ENDOMECHANICALS) ×4 IMPLANT
PACK LAPAROSCOPY BASIN (CUSTOM PROCEDURE TRAY) ×4 IMPLANT
PACK TRENDGUARD 450 HYBRID PRO (MISCELLANEOUS) ×2 IMPLANT
PACK TRENDGUARD 600 HYBRD PROC (MISCELLANEOUS) IMPLANT
PAD OB MATERNITY 4.3X12.25 (PERSONAL CARE ITEMS) ×4 IMPLANT
POUCH SPECIMEN RETRIEVAL 10MM (ENDOMECHANICALS) ×4 IMPLANT
SET IRRIG TUBING LAPAROSCOPIC (IRRIGATION / IRRIGATOR) IMPLANT
SET TUBE SMOKE EVAC HIGH FLOW (TUBING) ×4 IMPLANT
SHEARS HARMONIC ACE PLUS 36CM (ENDOMECHANICALS) ×4 IMPLANT
SLEEVE SCD COMPRESS KNEE MED (MISCELLANEOUS) ×4 IMPLANT
SLEEVE XCEL OPT CAN 5 100 (ENDOMECHANICALS) ×4 IMPLANT
SOLUTION ELECTROLUBE (MISCELLANEOUS) ×4 IMPLANT
SUT MON AB 4-0 PS1 27 (SUTURE) ×4 IMPLANT
SUT VICRYL 0 UR6 27IN ABS (SUTURE) ×4 IMPLANT
TOWEL GREEN STERILE FF (TOWEL DISPOSABLE) ×8 IMPLANT
TRAY FOLEY W/BAG SLVR 14FR LF (SET/KITS/TRAYS/PACK) ×4 IMPLANT
TRENDGUARD 450 HYBRID PRO PACK (MISCELLANEOUS) ×4
TRENDGUARD 600 HYBRID PROC PK (MISCELLANEOUS)
TROCAR XCEL NON-BLD 11X100MML (ENDOMECHANICALS) ×4 IMPLANT
TROCAR XCEL NON-BLD 5MMX100MML (ENDOMECHANICALS) ×4 IMPLANT
WARMER LAPAROSCOPE (MISCELLANEOUS) ×4 IMPLANT

## 2020-05-09 NOTE — Interval H&P Note (Signed)
History and Physical Interval Note:  05/09/2020 8:08 AM  [name]  has presented today for surgery, with the diagnosis of N83.201 Right Ovarian Cyst.  The various methods of treatment have been discussed with the patient and family. After consideration of risks, benefits and other options for treatment, the patient has consented to  Procedure(s): LAPAROSCOPIC OOPHORECTOMY (Right) LAPAROSCOPIC BILATERAL SALPINGECTOMY (Bilateral) as a surgical intervention.  The patient's history has been reviewed, patient examined, no change in status, stable for surgery.  I have reviewed the patient's chart and labs.  Questions were answered to the patient's satisfaction.     Annalee Genta

## 2020-05-09 NOTE — Anesthesia Procedure Notes (Signed)
Procedure Name: Intubation Date/Time: 05/09/2020 8:34 AM Performed by: Collier Bullock, CRNA Pre-anesthesia Checklist: Patient identified, Emergency Drugs available, Suction available and Patient being monitored Patient Re-evaluated:Patient Re-evaluated prior to induction Oxygen Delivery Method: Circle system utilized Preoxygenation: Pre-oxygenation with 100% oxygen Induction Type: IV induction Ventilation: Mask ventilation without difficulty Laryngoscope Size: Mac and 4 Grade View: Grade I Tube type: Oral Tube size: 7.0 mm Number of attempts: 1 Airway Equipment and Method: Stylet Placement Confirmation: ETT inserted through vocal cords under direct vision,  positive ETCO2 and breath sounds checked- equal and bilateral Secured at: 22 cm Tube secured with: Tape Dental Injury: Teeth and Oropharynx as per pre-operative assessment

## 2020-05-09 NOTE — Transfer of Care (Signed)
Immediate Anesthesia Transfer of Care Note  Patient: [name]  Procedure(s) Performed: LAPAROSCOPIC OOPHORECTOMY (Right Abdomen) LAPAROSCOPIC BILATERAL SALPINGECTOMY (Bilateral Abdomen)  Patient Location: PACU  Anesthesia Type:General  Level of Consciousness: awake, alert , oriented and drowsy  Airway & Oxygen Therapy: Patient Spontanous Breathing and Patient connected to face mask oxygen  Post-op Assessment: Report given to RN, Post -op Vital signs reviewed and stable, Patient moving all extremities X 4 and Patient able to stick tongue midline  Post vital signs: Reviewed and stable  Last Vitals:  Vitals Value Taken Time  BP 102/53 05/09/20 0951  Temp    Pulse 78 05/09/20 0953  Resp 22 05/09/20 0953  SpO2 100 % 05/09/20 0953  Vitals shown include unvalidated device data.  Last Pain:  Vitals:   05/09/20 0757  TempSrc: Oral  PainSc:          Complications: No complications documented.

## 2020-05-09 NOTE — Anesthesia Postprocedure Evaluation (Signed)
Anesthesia Post Note  Patient: [name]  Procedure(s) Performed: LAPAROSCOPIC OOPHORECTOMY (Right Abdomen) LAPAROSCOPIC BILATERAL SALPINGECTOMY (Bilateral Abdomen)     Patient location during evaluation: PACU Anesthesia Type: General Level of consciousness: awake and alert, patient cooperative and oriented Pain management: pain level controlled Vital Signs Assessment: post-procedure vital signs reviewed and stable Respiratory status: spontaneous breathing, nonlabored ventilation and respiratory function stable Cardiovascular status: blood pressure returned to baseline and stable Postop Assessment: no apparent nausea or vomiting Anesthetic complications: no   No complications documented.  Last Vitals:  Vitals:   05/09/20 1042 05/09/20 1045  BP:  112/64  Pulse: 66 64  Resp: 16 16  Temp: (!) 36.2 C (!) 36.2 C  SpO2: 100% 100%    Last Pain:  Vitals:   05/09/20 1045  TempSrc: Oral  PainSc: 5                  Mariangel Ringley,E. Micaela Stith

## 2020-05-09 NOTE — Discharge Instructions (Signed)
HOME INSTRUCTIONS  Please note any unusual or excessive bleeding, pain, swelling. Mild dizziness or drowsiness are normal for about 24 hours after surgery.   Shower when comfortable  Restrictions: No driving for 24 hours or while taking pain medications.  Activity:  No heavy lifting (> 10 lbs), nothing in vagina (no tampons, douching, or intercourse) x 2 weeks; no tub baths for 2 weeks Vaginal spotting is expected but if your bleeding is heavy, period like,  please call the office   Diet:  You may return to your regular diet.  Do not eat large meals.  Eat small frequent meals throughout the day.  Continue to drink a good amount of water at least 6-8 glasses of water per day, hydration is very important for the healing process.  Pain Management: Take Motrin and/or Tylenol as needed for pain.  For severe or breakthrough pain- please take oxycodone as needed.  Always take prescription pain medication with food, it may cause constipation, increase fluids and fiber and you may want to take an over-the-counter stool softener like Colace as needed up to 2x a day.    Alcohol -- Avoid for 24 hours and while taking pain medications.  Nausea: Take sips of ginger ale or soda  Fever -- Call physician if temperature over 101 degrees  Follow up:  If you do not already have a follow up appointment scheduled, please call the office at [phone].  If you experience fever (a temperature greater than 100.4), pain unrelieved by pain medication, shortness of breath, swelling of a single leg, or any other symptoms which are concerning to you please the office immediately.   Post Anesthesia Home Care Instructions  Activity: Get plenty of rest for the remainder of the day. A responsible individual must stay with you for 24 hours following the procedure.  For the next 24 hours, DO NOT: -Drive a car -Paediatric nurse -Drink alcoholic beverages -Take any medication unless instructed by your physician -Make  any legal decisions or sign important papers.  Meals: Start with liquid foods such as gelatin or soup. Progress to regular foods as tolerated. Avoid greasy, spicy, heavy foods. If nausea and/or vomiting occur, drink only clear liquids until the nausea and/or vomiting subsides. Call your physician if vomiting continues.  Special Instructions/Symptoms: Your throat may feel dry or sore from the anesthesia or the breathing tube placed in your throat during surgery. If this causes discomfort, gargle with warm salt water. The discomfort should disappear within 24 hours.  If you had a scopolamine patch placed behind your ear for the management of post- operative nausea and/or vomiting:  1. The medication in the patch is effective for 72 hours, after which it should be removed.  Wrap patch in a tissue and discard in the trash. Wash hands thoroughly with soap and water. 2. You may remove the patch earlier than 72 hours if you experience unpleasant side effects which may include dry mouth, dizziness or visual disturbances. 3. Avoid touching the patch. Wash your hands with soap and water after contact with the patch.      NO TYLENOL UNTIL 2:10 PM OXYCODONE 5mg  GIVEN AT 10:40 am   Post Anesthesia Home Care Instructions  Activity: Get plenty of rest for the remainder of the day. A responsible individual must stay with you for 24 hours following the procedure.  For the next 24 hours, DO NOT: -Drive a car -Paediatric nurse -Drink alcoholic beverages -Take any medication unless instructed by your physician -Make any  legal decisions or sign important papers.  Meals: Start with liquid foods such as gelatin or soup. Progress to regular foods as tolerated. Avoid greasy, spicy, heavy foods. If nausea and/or vomiting occur, drink only clear liquids until the nausea and/or vomiting subsides. Call your physician if vomiting continues.  Special Instructions/Symptoms: Your throat may feel dry or sore from  the anesthesia or the breathing tube placed in your throat during surgery. If this causes discomfort, gargle with warm salt water. The discomfort should disappear within 24 hours.  If you had a scopolamine patch placed behind your ear for the management of post- operative nausea and/or vomiting:  1. The medication in the patch is effective for 72 hours, after which it should be removed.  Wrap patch in a tissue and discard in the trash. Wash hands thoroughly with soap and water. 2. You may remove the patch earlier than 72 hours if you experience unpleasant side effects which may include dry mouth, dizziness or visual disturbances. 3. Avoid touching the patch. Wash your hands with soap and water after contact with the patch.

## 2020-05-09 NOTE — Anesthesia Preprocedure Evaluation (Addendum)
Anesthesia Evaluation  Patient identified by MRN, date of birth, ID band Patient awake    Reviewed: Allergy & Precautions, NPO status , Patient's Chart, lab work & pertinent test results  History of Anesthesia Complications Negative for: history of anesthetic complications  Airway Mallampati: I  TM Distance: >3 FB Neck ROM: Full    Dental  (+) Dental Advisory Given, Teeth Intact   Pulmonary neg pulmonary ROS,  05/07/2020 SARS coronavirus NEG   breath sounds clear to auscultation       Cardiovascular (-) hypertensionnegative cardio ROS   Rhythm:Regular Rate:Normal     Neuro/Psych negative neurological ROS     GI/Hepatic negative GI ROS, Neg liver ROS,   Endo/Other  diabetes (not diabetic presently), Gestational  Renal/GU negative Renal ROS     Musculoskeletal   Abdominal   Peds  Hematology negative hematology ROS (+)   Anesthesia Other Findings   Reproductive/Obstetrics                            Anesthesia Physical Anesthesia Plan  ASA: I  Anesthesia Plan: General   Post-op Pain Management:    Induction: Intravenous  PONV Risk Score and Plan: 3 and Ondansetron, Dexamethasone and Scopolamine patch - Pre-op  Airway Management Planned: Oral ETT  Additional Equipment: None  Intra-op Plan:   Post-operative Plan: Extubation in OR  Informed Consent: I have reviewed the patients History and Physical, chart, labs and discussed the procedure including the risks, benefits and alternatives for the proposed anesthesia with the patient or authorized representative who has indicated his/her understanding and acceptance.     Dental advisory given  Plan Discussed with: CRNA and Surgeon  Anesthesia Plan Comments:        Anesthesia Quick Evaluation

## 2020-05-10 LAB — SURGICAL PATHOLOGY

## 2020-05-10 NOTE — Op Note (Signed)
PREOPERATIVE DIAGNOSIS:  1) Right dermoid cyst 2) Desire for sterilization POSTOPERATIVE DIAGNOSIS: same PROCEDURE PERFORMED: Right oophorectomy, bilateral salpingectomy SURGEON: Dr. Janyth Pupa ASSISTANT:Dr. Drema Dallas ANESTHESIA: General endotracheal.  ESTIMATED BLOOD LOSS: 30cc.  URINE OUTPUT: 100cc of clear yellow urine at the end of the procedure.  IV FLUIDS: 1500cc of crystalloid.  SPECIMEN(S): 1) right ovary 2) bilateral fallopian tubes COMPLICATIONS: None.  CONDITION: Stable.  FINDINGS: No ascites or peritoneal studding was appreciated.  Liver and bowel appeared grossly normal.  Uterus normal size and shape.  Enlarged right ovary, normal left ovary and bilateral fallopian tubes.  Informed consent was obtained from the patient prior to taking her to the operating room where anesthesia was found to be adequate. She was placed in dorsal lithotomy position and examined under anesthesia. She was prepped and draped in normal sterile fashion. The bladder was catheterized with a foley under sterile technique.  A bi-valve speculum was then placed and the anterior lip of the cervix was grasped with the single tooth tenaculum. The uterus was sounded to 7cm, a uterine manipulator was then advanced into the uterus to provide uterine mobility. The speculum and tenaculum were then removed.  Attention was then turned to the patients abdomen where a 10 mm infraumbilical skin incision was made with the scalpel. The veress needle was carefully introduced into the peritoneal cavity while tenting the abdominal wall. Intraperitoneal placement was confirmed by use of a saline-drop test.  The gas was connected and confirmed intrabdominal placement by a low initial pressure of 46mmHg. The abdomen was then insuflated with CO2 gas. The trocar and sleeve were then advanced without difficulty into the abdomen under direct visualization. Intraabdominal placement was confirmed by the laparoscope and surveillance of  the abdomen was performed. Grossly normal appearing abdomen as mentioned in findings above.  Two additional 31mm skin incision were made in the left and right lower quadrants with placement of the trocar under direct visualization.   The left ureter was identified.  The left fallopian tube was placed on tension and using the Harmonic, serial ligations was completed. Attention was turned to the right side and the ureter was identified.  The adnexal structures were grasped.  Using the harmonic serial ligation of the tube and ovary was completed to the cornua.  The endocatch bag was placed through the 10 mm port and the specimens were removed in their entirety. Re-examination of the uterus and abdominal cavity confirmed hemostasis. A Carter-Thompson was inserted into the umbilical port for closure of the fascia. The fascia was closed using 0-vicryl without difficulty under direct visualization. The instruments were removed from the patients abdomen with air allowed to fully escape. The port sites were then closed with monocryl and dermabond. The manipulator and foley catheter was removed from the cervix with no lacerations or bleeding identified. The patient tolerated the procedure well with all sponge, lap, and needle counts correct. The patient was taken to recovery in stable condition.   Janyth Pupa, DO [phone] (cell) [phone] (office)

## 2020-05-11 ENCOUNTER — Encounter (HOSPITAL_BASED_OUTPATIENT_CLINIC_OR_DEPARTMENT_OTHER): Payer: Self-pay | Admitting: Obstetrics & Gynecology

## 2020-05-23 DIAGNOSIS — Z9079 Acquired absence of other genital organ(s): Secondary | ICD-10-CM | POA: Diagnosis not present

## 2020-05-24 ENCOUNTER — Other Ambulatory Visit: Payer: Self-pay | Admitting: Obstetrics and Gynecology

## 2020-05-24 DIAGNOSIS — Z1231 Encounter for screening mammogram for malignant neoplasm of breast: Secondary | ICD-10-CM

## 2020-06-20 ENCOUNTER — Other Ambulatory Visit: Payer: Self-pay

## 2020-06-20 ENCOUNTER — Ambulatory Visit
Admission: RE | Admit: 2020-06-20 | Discharge: 2020-06-20 | Disposition: A | Discharge status: Home / Self Care | Payer: BC Managed Care – PPO | Source: Ambulatory Visit | Attending: Obstetrics and Gynecology | Admitting: Obstetrics and Gynecology

## 2020-06-20 DIAGNOSIS — Z1231 Encounter for screening mammogram for malignant neoplasm of breast: Secondary | ICD-10-CM

## 2020-08-02 DIAGNOSIS — R140 Abdominal distension (gaseous): Secondary | ICD-10-CM | POA: Diagnosis not present

## 2020-08-02 DIAGNOSIS — Z1211 Encounter for screening for malignant neoplasm of colon: Secondary | ICD-10-CM | POA: Diagnosis not present

## 2020-08-02 DIAGNOSIS — R194 Change in bowel habit: Secondary | ICD-10-CM | POA: Diagnosis not present

## 2020-08-02 DIAGNOSIS — K625 Hemorrhage of anus and rectum: Secondary | ICD-10-CM | POA: Diagnosis not present

## 2020-08-22 DIAGNOSIS — Z1211 Encounter for screening for malignant neoplasm of colon: Secondary | ICD-10-CM | POA: Diagnosis not present

## 2020-08-22 DIAGNOSIS — K625 Hemorrhage of anus and rectum: Secondary | ICD-10-CM | POA: Diagnosis not present

## 2020-08-22 DIAGNOSIS — R194 Change in bowel habit: Secondary | ICD-10-CM | POA: Diagnosis not present

## 2021-05-20 ENCOUNTER — Other Ambulatory Visit: Payer: Self-pay | Admitting: Obstetrics and Gynecology

## 2021-05-20 ENCOUNTER — Other Ambulatory Visit: Payer: Self-pay | Admitting: Family Medicine

## 2021-05-20 DIAGNOSIS — Z1231 Encounter for screening mammogram for malignant neoplasm of breast: Secondary | ICD-10-CM

## 2021-06-04 DIAGNOSIS — Z1231 Encounter for screening mammogram for malignant neoplasm of breast: Secondary | ICD-10-CM | POA: Diagnosis not present

## 2021-06-04 DIAGNOSIS — Z1211 Encounter for screening for malignant neoplasm of colon: Secondary | ICD-10-CM | POA: Diagnosis not present

## 2021-06-04 DIAGNOSIS — Z01419 Encounter for gynecological examination (general) (routine) without abnormal findings: Secondary | ICD-10-CM | POA: Diagnosis not present

## 2021-06-04 DIAGNOSIS — Z124 Encounter for screening for malignant neoplasm of cervix: Secondary | ICD-10-CM | POA: Diagnosis not present

## 2021-06-13 DIAGNOSIS — Z0000 Encounter for general adult medical examination without abnormal findings: Secondary | ICD-10-CM | POA: Diagnosis not present

## 2021-06-25 ENCOUNTER — Ambulatory Visit
Admission: RE | Admit: 2021-06-25 | Discharge: 2021-06-25 | Disposition: A | Discharge status: Home / Self Care | Payer: BC Managed Care – PPO | Source: Ambulatory Visit | Attending: Obstetrics and Gynecology | Admitting: Obstetrics and Gynecology

## 2021-06-25 ENCOUNTER — Other Ambulatory Visit: Payer: Self-pay

## 2021-06-25 DIAGNOSIS — Z1231 Encounter for screening mammogram for malignant neoplasm of breast: Secondary | ICD-10-CM

## 2022-05-20 ENCOUNTER — Other Ambulatory Visit: Payer: Self-pay | Admitting: Obstetrics and Gynecology

## 2022-05-20 DIAGNOSIS — Z1231 Encounter for screening mammogram for malignant neoplasm of breast: Secondary | ICD-10-CM

## 2022-06-05 DIAGNOSIS — Z6827 Body mass index (BMI) 27.0-27.9, adult: Secondary | ICD-10-CM | POA: Diagnosis not present

## 2022-06-05 DIAGNOSIS — Z124 Encounter for screening for malignant neoplasm of cervix: Secondary | ICD-10-CM | POA: Diagnosis not present

## 2022-06-05 DIAGNOSIS — Z1231 Encounter for screening mammogram for malignant neoplasm of breast: Secondary | ICD-10-CM | POA: Diagnosis not present

## 2022-06-05 DIAGNOSIS — Z01419 Encounter for gynecological examination (general) (routine) without abnormal findings: Secondary | ICD-10-CM | POA: Diagnosis not present

## 2022-06-26 ENCOUNTER — Ambulatory Visit: Payer: BC Managed Care – PPO

## 2022-07-17 ENCOUNTER — Ambulatory Visit: Payer: BC Managed Care – PPO

## 2022-09-11 ENCOUNTER — Ambulatory Visit
Admission: RE | Admit: 2022-09-11 | Discharge: 2022-09-11 | Disposition: A | Discharge status: Home / Self Care | Payer: BC Managed Care – PPO | Source: Ambulatory Visit | Attending: Obstetrics and Gynecology | Admitting: Obstetrics and Gynecology

## 2022-09-11 DIAGNOSIS — Z1231 Encounter for screening mammogram for malignant neoplasm of breast: Secondary | ICD-10-CM | POA: Diagnosis not present

## 2022-10-16 DIAGNOSIS — R051 Acute cough: Secondary | ICD-10-CM | POA: Diagnosis not present

## 2023-07-22 ENCOUNTER — Other Ambulatory Visit: Payer: Self-pay | Admitting: Obstetrics and Gynecology

## 2023-07-22 DIAGNOSIS — Z1231 Encounter for screening mammogram for malignant neoplasm of breast: Secondary | ICD-10-CM

## 2023-07-24 IMAGING — MG MM DIGITAL SCREENING BILAT W/ TOMO AND CAD
8 series · 8 of 24 positions shown · non-contrast
Comparison: Previous exam(s).

CLINICAL DATA: Screening.

EXAM:
DIGITAL SCREENING BILATERAL MAMMOGRAM WITH TOMOSYNTHESIS AND CAD
TECHNIQUE: Bilateral screening digital craniocaudal and mediolateral oblique
mammograms were obtained. Bilateral screening digital breast
tomosynthesis was performed. The images were evaluated with
computer-aided detection.

[R MLO synth-2D]
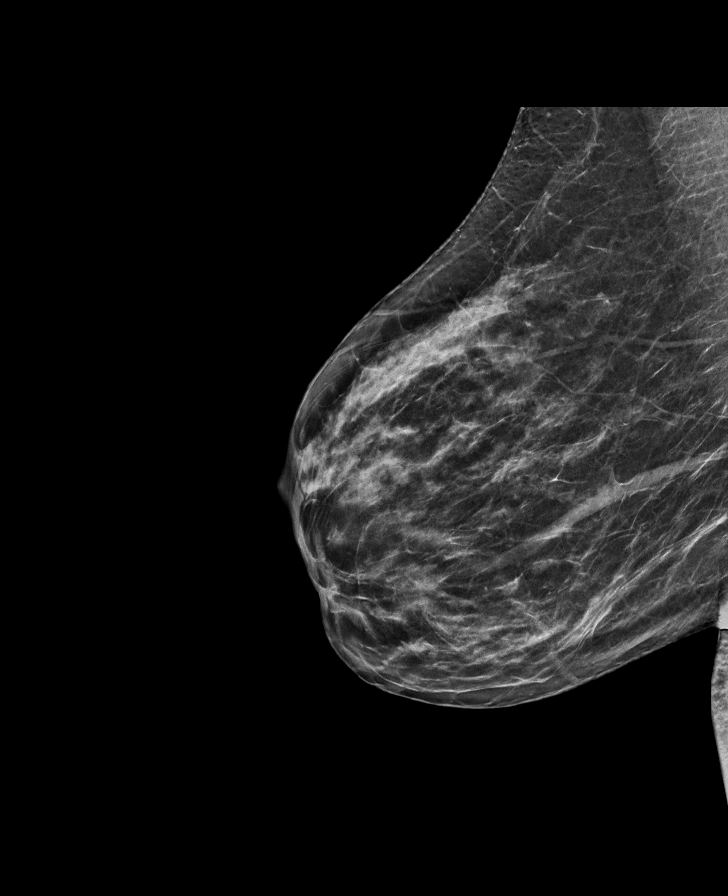

[L CC synth-2D]
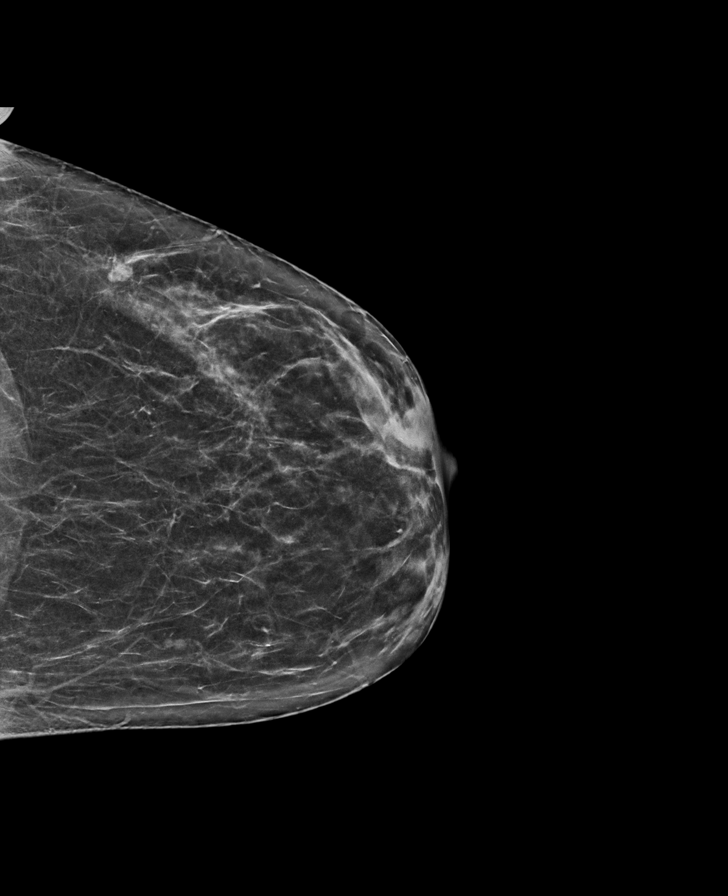

[R CC synth-2D]
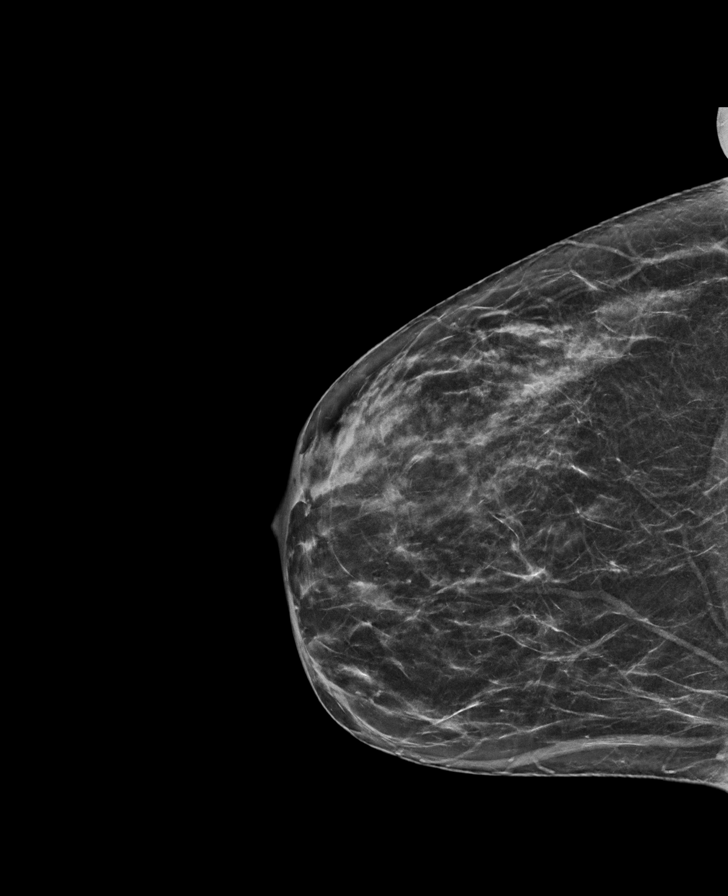

[L MLO synth-2D]
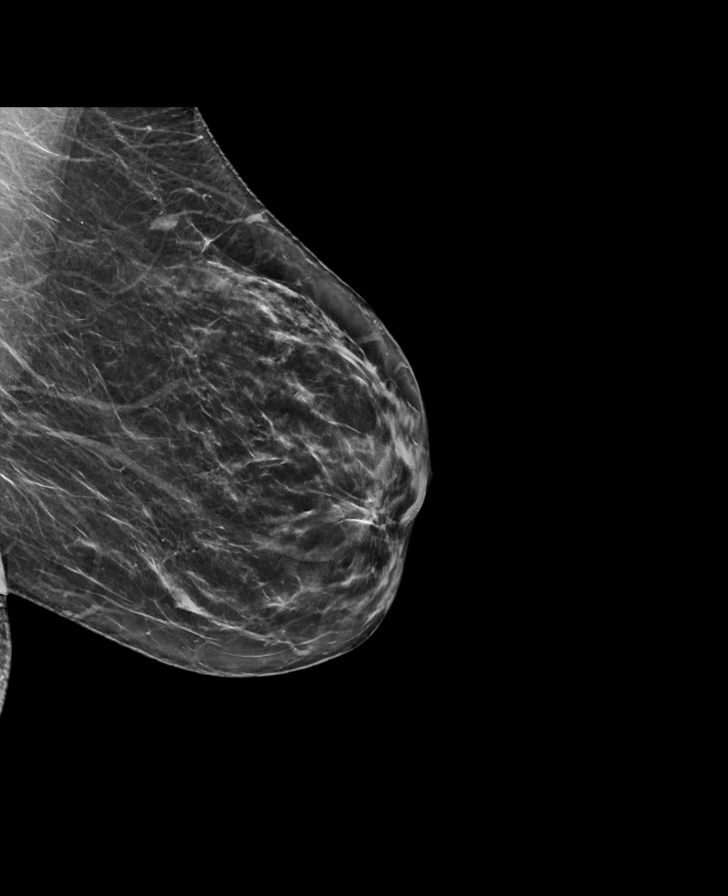

[R CC tomo · tomo slice 31/60.0]
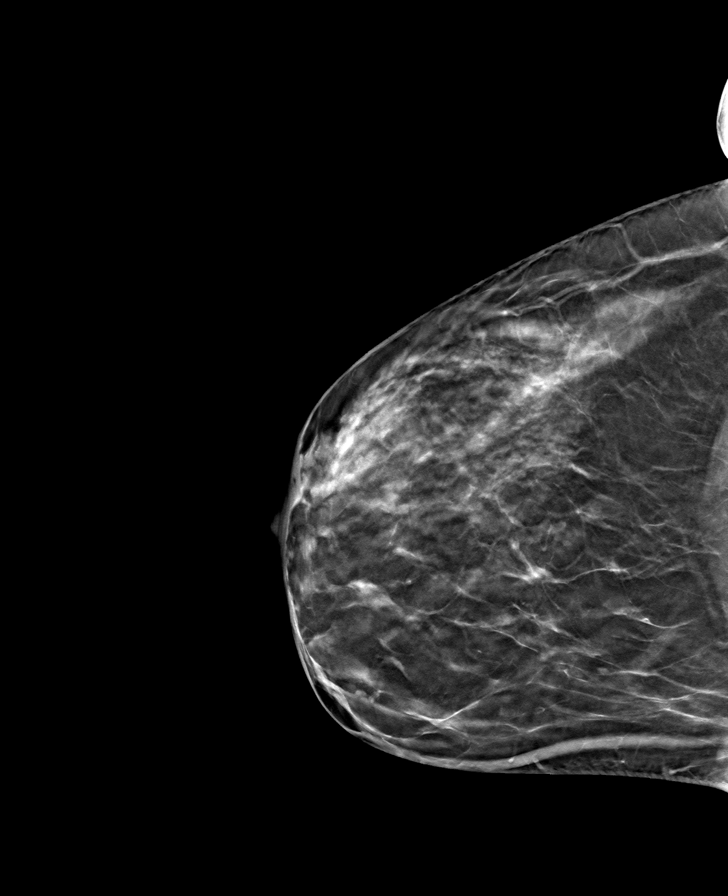

[L CC tomo · tomo slice 31/62.0]
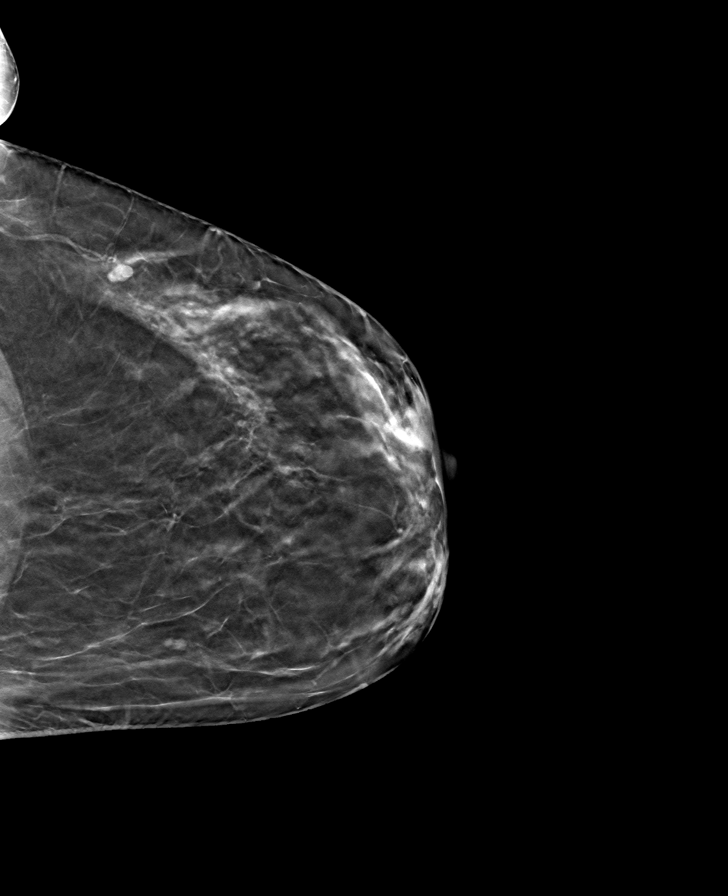

[L MLO tomo · tomo slice 35/69.0]
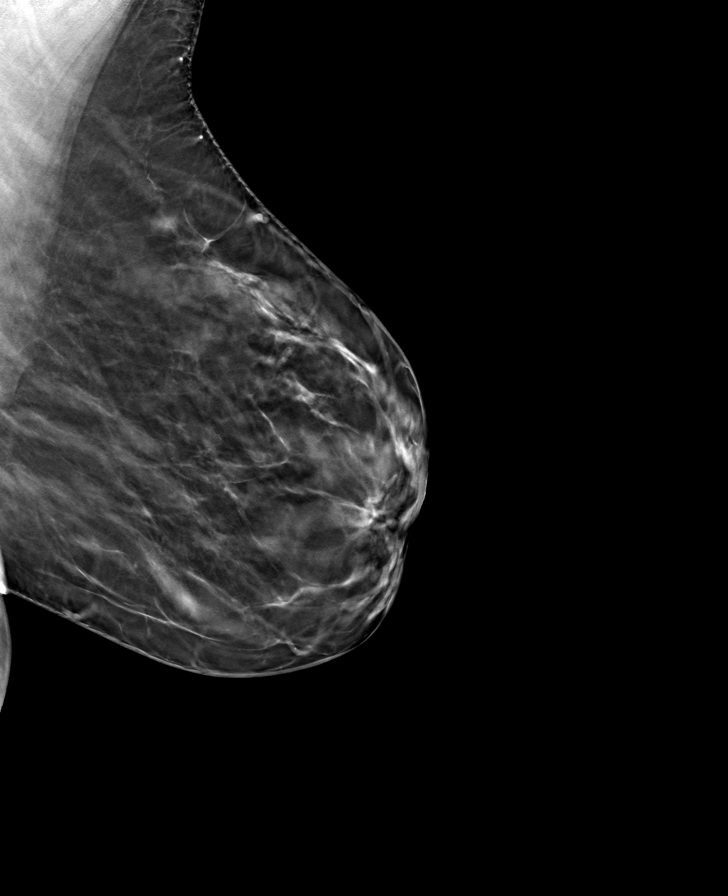

[R MLO tomo · tomo slice 32/63.0]
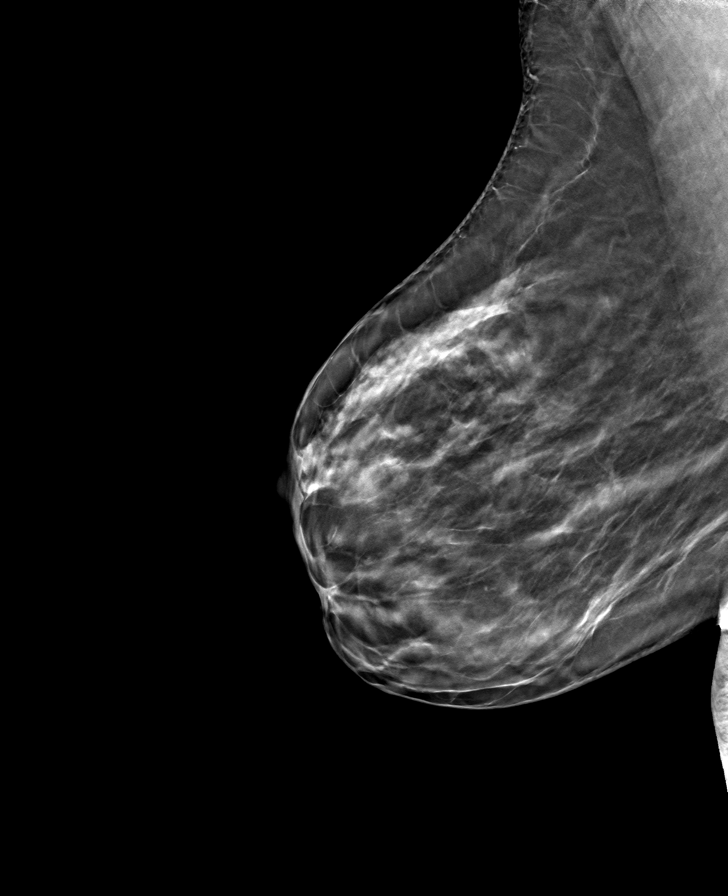

[8 of 24 positions shown; findings below may reference images not displayed]

ACR Breast Density Category c: The breast tissue is heterogeneously
dense, which may obscure small masses.
FINDINGS: There are no findings suspicious for malignancy.
IMPRESSION: No mammographic evidence of malignancy. A result letter of this
screening mammogram will be mailed directly to the patient.

RECOMMENDATION:
Screening mammogram in one year. (Code:Q3-W-BC3)

BI-RADS CATEGORY  1: Negative.

## 2023-07-27 DIAGNOSIS — Z1330 Encounter for screening examination for mental health and behavioral disorders, unspecified: Secondary | ICD-10-CM | POA: Diagnosis not present

## 2023-07-27 DIAGNOSIS — Z01419 Encounter for gynecological examination (general) (routine) without abnormal findings: Secondary | ICD-10-CM | POA: Diagnosis not present

## 2023-09-17 ENCOUNTER — Ambulatory Visit: Payer: BC Managed Care – PPO

## 2023-09-24 ENCOUNTER — Ambulatory Visit
Admission: RE | Admit: 2023-09-24 | Discharge: 2023-09-24 | Disposition: A | Discharge status: Home / Self Care | Payer: BC Managed Care – PPO | Source: Ambulatory Visit | Attending: Obstetrics and Gynecology | Admitting: Obstetrics and Gynecology

## 2023-09-24 DIAGNOSIS — Z1231 Encounter for screening mammogram for malignant neoplasm of breast: Secondary | ICD-10-CM
